# Patient Record
Sex: Male | Born: 1955 | Race: White | Hispanic: No | Marital: Married | Smoking: Never smoker
Health system: Southern US, Community
[De-identification: ages and names within clinical notes are randomized; demographics above are authoritative.]

## PROBLEM LIST (undated history)

## (undated) DIAGNOSIS — T4145XA Adverse effect of unspecified anesthetic, initial encounter: Secondary | ICD-10-CM

## (undated) DIAGNOSIS — K219 Gastro-esophageal reflux disease without esophagitis: Secondary | ICD-10-CM

## (undated) DIAGNOSIS — G8929 Other chronic pain: Secondary | ICD-10-CM

## (undated) DIAGNOSIS — T8859XA Other complications of anesthesia, initial encounter: Secondary | ICD-10-CM

## (undated) DIAGNOSIS — I10 Essential (primary) hypertension: Secondary | ICD-10-CM

## (undated) HISTORY — DX: Other chronic pain: G89.29

## (undated) HISTORY — PX: BACK SURGERY: SHX140

## (undated) HISTORY — PX: ANKLE SURGERY: SHX546

## (undated) HISTORY — PX: WISDOM TOOTH EXTRACTION: SHX21

## (undated) HISTORY — PX: TONSILLECTOMY: SUR1361

---

## 2016-06-05 ENCOUNTER — Other Ambulatory Visit: Payer: Self-pay | Admitting: Orthopedic Surgery

## 2016-06-26 ENCOUNTER — Encounter (HOSPITAL_COMMUNITY)
Admission: RE | Admit: 2016-06-26 | Discharge: 2016-06-26 | Disposition: A | Discharge status: Home / Self Care | Payer: Worker's Compensation | Source: Ambulatory Visit | Attending: Orthopedic Surgery | Admitting: Orthopedic Surgery

## 2016-06-26 ENCOUNTER — Other Ambulatory Visit (HOSPITAL_COMMUNITY): Payer: Self-pay | Admitting: *Deleted

## 2016-06-26 ENCOUNTER — Encounter (HOSPITAL_COMMUNITY): Payer: Self-pay

## 2016-06-26 DIAGNOSIS — Z01810 Encounter for preprocedural cardiovascular examination: Secondary | ICD-10-CM | POA: Diagnosis present

## 2016-06-26 DIAGNOSIS — M79604 Pain in right leg: Secondary | ICD-10-CM | POA: Insufficient documentation

## 2016-06-26 DIAGNOSIS — I700 Atherosclerosis of aorta: Secondary | ICD-10-CM | POA: Insufficient documentation

## 2016-06-26 DIAGNOSIS — Z01812 Encounter for preprocedural laboratory examination: Secondary | ICD-10-CM | POA: Insufficient documentation

## 2016-06-26 DIAGNOSIS — Z01818 Encounter for other preprocedural examination: Secondary | ICD-10-CM | POA: Diagnosis not present

## 2016-06-26 DIAGNOSIS — K219 Gastro-esophageal reflux disease without esophagitis: Secondary | ICD-10-CM | POA: Insufficient documentation

## 2016-06-26 DIAGNOSIS — M79605 Pain in left leg: Secondary | ICD-10-CM | POA: Insufficient documentation

## 2016-06-26 DIAGNOSIS — K210 Gastro-esophageal reflux disease with esophagitis: Secondary | ICD-10-CM | POA: Insufficient documentation

## 2016-06-26 DIAGNOSIS — I10 Essential (primary) hypertension: Secondary | ICD-10-CM | POA: Insufficient documentation

## 2016-06-26 HISTORY — DX: Gastro-esophageal reflux disease without esophagitis: K21.9

## 2016-06-26 HISTORY — DX: Essential (primary) hypertension: I10

## 2016-06-26 HISTORY — DX: Adverse effect of unspecified anesthetic, initial encounter: T41.45XA

## 2016-06-26 HISTORY — DX: Other complications of anesthesia, initial encounter: T88.59XA

## 2016-06-26 LAB — CBC WITH DIFFERENTIAL/PLATELET
Basophils Absolute: 0 10*3/uL (ref 0.0–0.1)
Basophils Relative: 1 %
EOS ABS: 0.1 10*3/uL (ref 0.0–0.7)
Eosinophils Relative: 1 %
HCT: 44.4 % (ref 39.0–52.0)
Hemoglobin: 14.7 g/dL (ref 13.0–17.0)
LYMPHS ABS: 1.8 10*3/uL (ref 0.7–4.0)
Lymphocytes Relative: 21 %
MCH: 29.7 pg (ref 26.0–34.0)
MCHC: 33.1 g/dL (ref 30.0–36.0)
MCV: 89.7 fL (ref 78.0–100.0)
Monocytes Absolute: 0.8 10*3/uL (ref 0.1–1.0)
Monocytes Relative: 10 %
Neutro Abs: 5.9 10*3/uL (ref 1.7–7.7)
Neutrophils Relative %: 67 %
Platelets: 210 10*3/uL (ref 150–400)
RBC: 4.95 MIL/uL (ref 4.22–5.81)
RDW: 13.4 % (ref 11.5–15.5)
WBC: 8.6 10*3/uL (ref 4.0–10.5)

## 2016-06-26 LAB — COMPREHENSIVE METABOLIC PANEL
ALBUMIN: 4.3 g/dL (ref 3.5–5.0)
ALK PHOS: 58 U/L (ref 38–126)
ALT: 35 U/L (ref 17–63)
AST: 28 U/L (ref 15–41)
Anion gap: 10 (ref 5–15)
BUN: 12 mg/dL (ref 6–20)
CALCIUM: 9.9 mg/dL (ref 8.9–10.3)
CO2: 26 mmol/L (ref 22–32)
CREATININE: 1.37 mg/dL — AB (ref 0.61–1.24)
Chloride: 104 mmol/L (ref 101–111)
GFR calc non Af Amer: 54 mL/min — ABNORMAL LOW (ref >60)
GLUCOSE: 93 mg/dL (ref 65–99)
Potassium: 4 mmol/L (ref 3.5–5.1)
SODIUM: 140 mmol/L (ref 135–145)
Total Bilirubin: 0.4 mg/dL (ref 0.3–1.2)
Total Protein: 7.4 g/dL (ref 6.5–8.1)

## 2016-06-26 LAB — URINALYSIS, ROUTINE W REFLEX MICROSCOPIC
BILIRUBIN URINE: NEGATIVE
Bacteria, UA: NONE SEEN
Glucose, UA: NEGATIVE mg/dL
Hgb urine dipstick: NEGATIVE
Ketones, ur: NEGATIVE mg/dL
Nitrite: NEGATIVE
Protein, ur: NEGATIVE mg/dL
SQUAMOUS EPITHELIAL / LPF: NONE SEEN
Specific Gravity, Urine: 1.018 (ref 1.005–1.030)
pH: 6 (ref 5.0–8.0)

## 2016-06-26 LAB — PROTIME-INR
INR: 0.96
Prothrombin Time: 12.8 seconds (ref 11.4–15.2)

## 2016-06-26 LAB — TYPE AND SCREEN
ABO/RH(D): A POS
Antibody Screen: NEGATIVE

## 2016-06-26 LAB — ABO/RH: ABO/RH(D): A POS

## 2016-06-26 LAB — SURGICAL PCR SCREEN
MRSA, PCR: NEGATIVE
Staphylococcus aureus: NEGATIVE

## 2016-06-26 LAB — APTT: aPTT: 29 seconds (ref 24–36)

## 2016-06-26 NOTE — Pre-Procedure Instructions (Addendum)
George GhaziRobert A Aguirre  06/26/2016    Your procedure is scheduled on Thursday, July 03, 2016 at 12:10 PM.   [facility] "A" Admitting Office at 9:10 AM.   Call this number if you have problems the morning of surgery: [phone]   Questions prior to day of surgery, please call [phone] between 8 & 4 PM.   Remember:  Do not eat food or drink liquids after midnight Wednesday, 07/02/16. Medicines to take the day of surgery: NONE  Stop Multivitamins, NSAIDS (Ibuprofen, Aleve, etc), Aspirin, Herbal medications and Cod Liver Oil 5 days prior to surgery.    Do not wear jewelry.  Do not wear lotions, powders, cologne or deodorant.  Men may shave face and neck.  Do not bring valuables to the hospital.  [facility] is not responsible for any belongings or valuables.  Contacts, dentures or bridgework may not be worn into surgery.  Leave your suitcase in the car.  After surgery it may be brought to your room.  For patients admitted to the hospital, discharge time will be determined by your treatment team.  Special instructions:  [facility] - Preparing for Surgery  Before surgery, you can play an important role.  Because skin is not sterile, your skin needs to be as free of germs as possible.  You can reduce the number of germs on you skin by washing with CHG (chlorahexidine gluconate) soap before surgery.  CHG is an antiseptic cleaner which kills germs and bonds with the skin to continue killing germs even after washing.  Please DO NOT use if you have an allergy to CHG or antibacterial soaps.  If your skin becomes reddened/irritated stop using the CHG and inform your nurse when you arrive at Short Stay.  Do not shave (including legs and underarms) for at least 48 hours prior to the first CHG shower.  You may shave your face.  Please follow these instructions carefully:   1.  Shower with CHG Soap the night before surgery and the                    morning of  Surgery.  2.  If you choose to wash your hair, wash your hair first as usual with your       normal shampoo.  3.  After you shampoo, rinse your hair and body thoroughly to remove the shampoo.  4.  Use CHG as you would any other liquid soap.  You can apply chg directly       to the skin and wash gently with scrungie or a clean washcloth.  5.  Apply the CHG Soap to your body ONLY FROM THE NECK DOWN.        Do not use on open wounds or open sores.  Avoid contact with your eyes, ears, mouth and genitals (private parts).  Wash genitals (private parts) with your normal soap.  6.  Wash thoroughly, paying special attention to the area where your surgery        will be performed.  7.  Thoroughly rinse your body with warm water from the neck down.  8.  DO NOT shower/wash with your normal soap after using and rinsing off       the CHG Soap.  9.  Pat yourself dry with a clean towel.            10.  Wear clean pajamas.  11.  Place clean sheets on your bed the night of your first shower and do not        sleep with pets.  Day of Surgery  Do not apply any lotions/deodorants the morning of surgery.  Please wear clean clothes to the hospital.   Please read over the fact sheets that you were given.

## 2016-06-26 NOTE — Progress Notes (Signed)
PCP: Dr. [facility]ine [facility]ters at [facility] No cardiologist, pt reports [facility], and [facility], stress test and ECHO, [facility].   Pt reports possible reaction to Ketamines during [facility], hard to wake up and had to be re-intubated after surgery. No problems second surgery and per pt ketamines were not used during second surgery.   CXR: 06/26/16 EKG: 06/26/16   Chart forwarded to anesthesia d/t hx anesthesia reaction

## 2016-06-26 NOTE — Progress Notes (Addendum)
Anesthesia PAT Evaluation: Patient is a 61 year old male scheduled for right sided L4-5 lateral interbody fusion, L4-5 posterior lumbar fusion on 07/03/16 by Dr. Lynann Bologna.  History includes non-smoker, HTN, GERD, back surgery X 2, ankle surgery.   Anesthesia history: He reported that he had back surgery (at left L4-5) at [facility] [phone]) around 09/02/13. He was extubated, but once in recovery felt weak, couldn't really move, and was unable to get ice chips in his mouth. The last thing he remembers is the nurse putting a face mask on him and then waking up in the ICU. He says the anesthesiologist told his wife that they thought he reacted to ketamine. His second surgery (repeat left L4-5, and right L4-5) went smoothly, but they did not give him the believed offending agent (reported as ketamine). He is adopted and does not know if there is any family history of anesthesia reactions. He does not report a history of difficult airway/intubation, but on exam he does have prominent teeth with only 2-3 FB between his teeth. Neck is a little short.   PCP is Dr. Jonathon Jordan with [facility].  He is not regularly followed by a cardiologist, but his PCP at the time referred him to Dr. Hurshel Keys at [facility] [phone]). He reports that he had a negative exercise stress test and echo. Patient states it was routine testing--not for any specific symptoms--and did not require on-going cardiology follow-up. (Staff at Dr. Shelly Flatten office reported records would need to be requested from [facility])  Meds include amlodipine-benazepril, ASA 81 mg, cod liver oil, Norco, magnesium.   BP 132/86   Pulse 100   Temp 36.8 C   Resp 20   Ht 5' 11"  (1.803 m)   Wt 239 lb 12.8 oz (108.8 kg)   SpO2 99%   BMI 33.45 kg/m  Heart RRR, no murmur noted. Lungs clear. No ankle edema. No carotid bruits noted.  EKG 06/26/16: NSR at 79 bpm. Probable insignificant Q waves in  inferolateral leads. Non-specific inferior T wave abnormality. (Still waiting cardiology to confirm.)  Stress test 2014 [facility]): Requested.  Echo 2014 [facility]):   CXR 06/26/16: IMPRESSION: There is no acute cardiopulmonary abnormality. Thoracic aortic atherosclerosis.  Preoperative labs noted.  Cardiac testing and anesthesia records requested from [facility]. Additional input following review of records.  George Hugh [facility] Short Stay Center/Anesthesiology Phone [phone] 06/26/2016 11:16 AM  Addendum: The following records were received from [facility]. Anesthesia records summarized below, but copies can be found on patient's physical chart.  08/17/15: Lumbar laminotomy, partial facetectomy and foraminootmy with diskectomy and decompression L4-5 left side, redo. Right L4-5 laminotomy, partial facetectomy, and laminotomy with diskectomy.   Meds documented on anesthesia record include ISO, ROC, fentanyl, propofol, Zofran, Decadron, Neo-synephrine. Received reversal agent Bridion. MAC blade used to place 7.5 ETT X 1 attempt. Easy mask ventilation. He was discharged home on 08/18/15 (POD #1).  09/02/13: Left L4-5 laminotomy, discectomy, partial facetectomy and foraminotomy.  Meds documented on anesthesia record include ISO, ROC, fentanyl, propofol, Zofran, ketamine, Neo-Synephrine. Received reversal agents neostigmine and glycopyrrolate. MAC blade used to place 7.5 ETT X 2 attempts. Easy mask ventilation. The discharge summary was not telling of events described by patient (regarding response to ketamine). He was discharged home on 09/03/13 (POD #1).   Nuclear stress test 05/04/12: Impressions: 1. Myocardial perfusion imaging is normal. There is no ischemia or previous infarction in the current  study. Calculated EF 3%. There was no previous nuclear study for comparison. 2. The exercise ECG was negative. 3. Stress test was  clinically negative for chest pain. The patient demonstrated good exercise tolerance.  Echo 05/04/12: Summary: 1. Sinus rhythm. 2. This was a technically good study. 3. The left ventricular size is normal. 4. No regional wall motion abnormalities were noted. 5. Diastolic filling pattern indicates mild dysfunction (grade 1) with normal LA size. 6. The LA volume index is 17.48 mL/m consistent with normal left atrial volume. 7. Aortic valve is trileaflet and appears structurally normal. No aortic stenosis or regurgitation. 8. Mitral valve anatomy and motion appears normal. 9. There is trace to mild mitral regurgitation. 10. The tricuspid valve appears structurally normal. 11. Mild tricuspid regurgitation present. 12. The right ventricular systolic pressure, as measured by Doppler, is 32.32 mmHg. 13. There is no pericardial effusion.  EKG 06/26/16 was confirmed by cardiologist Dr. Doylene Canard as: NSR, nonspecific intraventricular conduction delay. Overall I think tracing is similar to 07/13/2015 tracing from [facility].  If no acute changes then I anticipate that he can proceed as planned. Definitive anesthesia plan will be discussed by his anesthesiologist on the day of surgery.  George Hugh [facility] Short Stay Center/Anesthesiology Phone [phone] 06/27/2016 10:39 AM

## 2016-07-02 ENCOUNTER — Encounter (HOSPITAL_COMMUNITY): Payer: Self-pay | Admitting: Anesthesiology

## 2016-07-02 NOTE — Anesthesia Preprocedure Evaluation (Addendum)
Anesthesia Evaluation  Patient identified by MRN, date of birth, ID band Patient awake    Reviewed: Allergy & Precautions, NPO status , Patient's Chart, lab work & pertinent test results  Airway Mallampati: III  TM Distance: >3 FB Neck ROM: Full  Mouth opening: Limited Mouth Opening  Dental  (+) Teeth Intact, Dental Advisory Given   Pulmonary neg pulmonary ROS,    breath sounds clear to auscultation       Cardiovascular hypertension, Pt. on medications negative cardio ROS   Rhythm:Regular Rate:Normal     Neuro/Psych negative neurological ROS  negative psych ROS   GI/Hepatic negative GI ROS, Neg liver ROS, GERD  Medicated,  Endo/Other  negative endocrine ROS  Renal/GU negative Renal ROS  negative genitourinary   Musculoskeletal negative musculoskeletal ROS (+)   Abdominal   Peds  Hematology negative hematology ROS (+)   Anesthesia Other Findings Day of surgery medications reviewed with the patient.  Reproductive/Obstetrics negative OB ROS                           Lab Results  Component Value Date   WBC 8.6 06/26/2016   HGB 14.7 06/26/2016   HCT 44.4 06/26/2016   MCV 89.7 06/26/2016   PLT 210 06/26/2016   Lab Results  Component Value Date   CREATININE 1.37 (H) 06/26/2016   BUN 12 06/26/2016   NA 140 06/26/2016   K 4.0 06/26/2016   CL 104 06/26/2016   CO2 26 06/26/2016   Lab Results  Component Value Date   INR 0.96 06/26/2016   EKG: NSR  Anesthesia Physical Anesthesia Plan  ASA: II  Anesthesia Plan: General   Post-op Pain Management:    Induction: Intravenous  Airway Management Planned: Oral ETT and Video Laryngoscope Planned  Additional Equipment:   Intra-op Plan:   Post-operative Plan: Extubation in OR  Informed Consent: I have reviewed the patients History and Physical, chart, labs and discussed the procedure including the risks, benefits and alternatives  for the proposed anesthesia with the patient or authorized representative who has indicated his/her understanding and acceptance.   Dental advisory given  Plan Discussed with: CRNA  Anesthesia Plan Comments: (Very limited mouth, recommend glidescope intubation for all future intubations. )      Anesthesia Quick Evaluation

## 2016-07-03 ENCOUNTER — Encounter (HOSPITAL_COMMUNITY): Payer: Self-pay | Admitting: Urology

## 2016-07-03 ENCOUNTER — Inpatient Hospital Stay (HOSPITAL_COMMUNITY): Payer: Worker's Compensation

## 2016-07-03 ENCOUNTER — Inpatient Hospital Stay (HOSPITAL_COMMUNITY)
Admission: RE | Admit: 2016-07-03 | Discharge: 2016-07-04 | DRG: 455 | Disposition: A | Discharge status: Home / Self Care | Payer: Worker's Compensation | Source: Ambulatory Visit | Attending: Orthopedic Surgery | Admitting: Orthopedic Surgery

## 2016-07-03 ENCOUNTER — Encounter (HOSPITAL_COMMUNITY)
Admission: RE | Disposition: A | Discharge status: Home / Self Care | Payer: Self-pay | Source: Ambulatory Visit | Attending: Orthopedic Surgery

## 2016-07-03 ENCOUNTER — Inpatient Hospital Stay (HOSPITAL_COMMUNITY): Payer: Worker's Compensation | Admitting: Anesthesiology

## 2016-07-03 ENCOUNTER — Inpatient Hospital Stay (HOSPITAL_COMMUNITY): Payer: Worker's Compensation | Admitting: Vascular Surgery

## 2016-07-03 DIAGNOSIS — Z7982 Long term (current) use of aspirin: Secondary | ICD-10-CM

## 2016-07-03 DIAGNOSIS — M5116 Intervertebral disc disorders with radiculopathy, lumbar region: Principal | ICD-10-CM | POA: Diagnosis present

## 2016-07-03 DIAGNOSIS — K219 Gastro-esophageal reflux disease without esophagitis: Secondary | ICD-10-CM | POA: Diagnosis present

## 2016-07-03 DIAGNOSIS — I10 Essential (primary) hypertension: Secondary | ICD-10-CM | POA: Diagnosis present

## 2016-07-03 DIAGNOSIS — Z880 Allergy status to penicillin: Secondary | ICD-10-CM

## 2016-07-03 DIAGNOSIS — M48061 Spinal stenosis, lumbar region without neurogenic claudication: Secondary | ICD-10-CM | POA: Diagnosis present

## 2016-07-03 DIAGNOSIS — Z79899 Other long term (current) drug therapy: Secondary | ICD-10-CM

## 2016-07-03 DIAGNOSIS — Z419 Encounter for procedure for purposes other than remedying health state, unspecified: Secondary | ICD-10-CM

## 2016-07-03 DIAGNOSIS — M545 Low back pain: Secondary | ICD-10-CM | POA: Diagnosis present

## 2016-07-03 DIAGNOSIS — M5410 Radiculopathy, site unspecified: Secondary | ICD-10-CM | POA: Diagnosis present

## 2016-07-03 HISTORY — PX: ANTERIOR LAT LUMBAR FUSION: SHX1168

## 2016-07-03 SURGERY — ANTERIOR LATERAL LUMBAR FUSION 1 LEVEL
Anesthesia: General | Laterality: Right

## 2016-07-03 MED ORDER — FLEET ENEMA 7-19 GM/118ML RE ENEM: 1.0000 | ENEMA | Freq: Once | RECTAL | Status: DC | PRN

## 2016-07-03 MED ORDER — PHENYLEPHRINE HCL 10 MG/ML IJ SOLN
INTRAVENOUS | Status: DC | PRN
  Administered 2016-07-03: 20 ug/min via INTRAVENOUS

## 2016-07-03 MED ORDER — ACETAMINOPHEN 650 MG RE SUPP: 650.0000 mg | RECTAL | Status: DC | PRN

## 2016-07-03 MED ORDER — CEFAZOLIN SODIUM-DEXTROSE 2-4 GM/100ML-% IV SOLN
2.0000 g | INTRAVENOUS | Status: DC
  Filled 2016-07-03: qty 100

## 2016-07-03 MED ORDER — ALUM & MAG HYDROXIDE-SIMETH 200-200-20 MG/5ML PO SUSP: 30.0000 mL | Freq: Four times a day (QID) | ORAL | Status: DC | PRN

## 2016-07-03 MED ORDER — BUPIVACAINE HCL (PF) 0.25 % IJ SOLN
INTRAMUSCULAR | Status: AC
  Filled 2016-07-03: qty 30

## 2016-07-03 MED ORDER — VANCOMYCIN HCL IN DEXTROSE 1-5 GM/200ML-% IV SOLN
INTRAVENOUS | Status: AC
Start: 2016-07-03 — End: 2016-07-03
  Filled 2016-07-03: qty 200

## 2016-07-03 MED ORDER — DEXAMETHASONE SODIUM PHOSPHATE 10 MG/ML IJ SOLN
INTRAMUSCULAR | Status: AC
  Filled 2016-07-03: qty 1

## 2016-07-03 MED ORDER — ACETAMINOPHEN 325 MG PO TABS
650.0000 mg | ORAL_TABLET | ORAL | Status: DC | PRN
Start: 2016-07-03 — End: 2016-07-04

## 2016-07-03 MED ORDER — SUCCINYLCHOLINE CHLORIDE 200 MG/10ML IV SOSY
PREFILLED_SYRINGE | INTRAVENOUS | Status: AC
  Filled 2016-07-03: qty 10

## 2016-07-03 MED ORDER — ZOLPIDEM TARTRATE 5 MG PO TABS
5.0000 mg | ORAL_TABLET | Freq: Every evening | ORAL | Status: DC | PRN
Start: 2016-07-03 — End: 2016-07-04

## 2016-07-03 MED ORDER — PROPOFOL 10 MG/ML IV BOLUS
INTRAVENOUS | Status: DC | PRN
  Administered 2016-07-03: 40 mg via INTRAVENOUS
  Administered 2016-07-03: 160 mg via INTRAVENOUS
  Administered 2016-07-03: 40 mg via INTRAVENOUS

## 2016-07-03 MED ORDER — LIDOCAINE 2% (20 MG/ML) 5 ML SYRINGE
INTRAMUSCULAR | Status: AC
  Filled 2016-07-03: qty 5

## 2016-07-03 MED ORDER — HEMOSTATIC AGENTS (NO CHARGE) OPTIME
TOPICAL | Status: DC | PRN
  Administered 2016-07-03: 1 via TOPICAL

## 2016-07-03 MED ORDER — LIDOCAINE HCL (CARDIAC) 20 MG/ML IV SOLN
INTRAVENOUS | Status: DC | PRN
  Administered 2016-07-03: 60 mg via INTRAVENOUS

## 2016-07-03 MED ORDER — BUPIVACAINE HCL (PF) 0.25 % IJ SOLN
INTRAMUSCULAR | Status: AC
  Filled 2016-07-03: qty 60

## 2016-07-03 MED ORDER — PHENOL 1.4 % MT LIQD: 1.0000 | OROMUCOSAL | Status: DC | PRN

## 2016-07-03 MED ORDER — AMLODIPINE BESY-BENAZEPRIL HCL 5-20 MG PO CAPS: 1.0000 | ORAL_CAPSULE | Freq: Every day | ORAL | Status: DC

## 2016-07-03 MED ORDER — FENTANYL CITRATE (PF) 100 MCG/2ML IJ SOLN
INTRAMUSCULAR | Status: AC
  Filled 2016-07-03: qty 2

## 2016-07-03 MED ORDER — LACTATED RINGERS IV SOLN
INTRAVENOUS | Status: DC | PRN
  Administered 2016-07-03 (×2): via INTRAVENOUS

## 2016-07-03 MED ORDER — ONDANSETRON HCL 4 MG PO TABS: 4.0000 mg | ORAL_TABLET | Freq: Four times a day (QID) | ORAL | Status: DC | PRN

## 2016-07-03 MED ORDER — PROPOFOL 500 MG/50ML IV EMUL
INTRAVENOUS | Status: DC | PRN
  Administered 2016-07-03: 90 ug/kg/min via INTRAVENOUS
  Administered 2016-07-03: 50 ug/kg/min via INTRAVENOUS
  Administered 2016-07-03: 11:00:00 via INTRAVENOUS

## 2016-07-03 MED ORDER — VITAMIN D 1000 UNITS PO TABS
2000.0000 [IU] | ORAL_TABLET | Freq: Every day | ORAL | Status: DC
  Administered 2016-07-04: 2000 [IU] via ORAL
  Filled 2016-07-03: qty 2

## 2016-07-03 MED ORDER — EPINEPHRINE PF 1 MG/ML IJ SOLN
INTRAMUSCULAR | Status: DC | PRN
  Administered 2016-07-03 (×2): .15 mL

## 2016-07-03 MED ORDER — LACTATED RINGERS IV SOLN: INTRAVENOUS | Status: DC

## 2016-07-03 MED ORDER — THROMBIN 20000 UNITS EX KIT
PACK | CUTANEOUS | Status: DC | PRN
  Administered 2016-07-03: 20000 [IU] via TOPICAL

## 2016-07-03 MED ORDER — MORPHINE SULFATE (PF) 2 MG/ML IV SOLN
1.0000 mg | INTRAVENOUS | Status: DC | PRN
Start: 2016-07-03 — End: 2016-07-04

## 2016-07-03 MED ORDER — MAGNESIUM OXIDE 400 (241.3 MG) MG PO TABS
400.0000 mg | ORAL_TABLET | Freq: Every day | ORAL | Status: DC
  Administered 2016-07-04: 400 mg via ORAL
  Filled 2016-07-03: qty 1

## 2016-07-03 MED ORDER — MEPERIDINE HCL 25 MG/ML IJ SOLN: 6.2500 mg | INTRAMUSCULAR | Status: DC | PRN

## 2016-07-03 MED ORDER — DIAZEPAM 5 MG PO TABS
ORAL_TABLET | ORAL | Status: AC
  Administered 2016-07-03: 5 mg via ORAL
  Filled 2016-07-03: qty 1

## 2016-07-03 MED ORDER — PHENYLEPHRINE HCL 10 MG/ML IJ SOLN
INTRAMUSCULAR | Status: AC
  Filled 2016-07-03: qty 1

## 2016-07-03 MED ORDER — AMLODIPINE BESYLATE 5 MG PO TABS
5.0000 mg | ORAL_TABLET | Freq: Every day | ORAL | Status: DC
  Administered 2016-07-04: 5 mg via ORAL
  Filled 2016-07-03: qty 1

## 2016-07-03 MED ORDER — POVIDONE-IODINE 7.5 % EX SOLN: Freq: Once | CUTANEOUS | Status: DC

## 2016-07-03 MED ORDER — METHYLENE BLUE 0.5 % INJ SOLN: INTRAVENOUS | Status: DC | PRN

## 2016-07-03 MED ORDER — VANCOMYCIN HCL 10 G IV SOLR
1500.0000 mg | Freq: Once | INTRAVENOUS | Status: AC
  Administered 2016-07-03: 1500 mg via INTRAVENOUS
  Filled 2016-07-03: qty 1500

## 2016-07-03 MED ORDER — MENTHOL 3 MG MT LOZG: 1.0000 | LOZENGE | OROMUCOSAL | Status: DC | PRN

## 2016-07-03 MED ORDER — BISACODYL 5 MG PO TBEC: 5.0000 mg | DELAYED_RELEASE_TABLET | Freq: Every day | ORAL | Status: DC | PRN

## 2016-07-03 MED ORDER — SUCCINYLCHOLINE CHLORIDE 20 MG/ML IJ SOLN
INTRAMUSCULAR | Status: DC | PRN
  Administered 2016-07-03: 140 mg via INTRAVENOUS

## 2016-07-03 MED ORDER — 0.9 % SODIUM CHLORIDE (POUR BTL) OPTIME
TOPICAL | Status: DC | PRN
  Administered 2016-07-03 (×2): 1000 mL

## 2016-07-03 MED ORDER — PANTOPRAZOLE SODIUM 40 MG IV SOLR
40.0000 mg | Freq: Every day | INTRAVENOUS | Status: DC
  Administered 2016-07-03: 40 mg via INTRAVENOUS
  Filled 2016-07-03: qty 40

## 2016-07-03 MED ORDER — POTASSIUM CHLORIDE IN NACL 20-0.9 MEQ/L-% IV SOLN
INTRAVENOUS | Status: DC
  Administered 2016-07-03: 10 mL/h via INTRAVENOUS
  Filled 2016-07-03: qty 1000

## 2016-07-03 MED ORDER — OXYCODONE HCL 5 MG PO TABS
5.0000 mg | ORAL_TABLET | ORAL | Status: DC | PRN
  Administered 2016-07-03: 5 mg via ORAL
  Administered 2016-07-03: 10 mg via ORAL
  Administered 2016-07-03 – 2016-07-04 (×4): 5 mg via ORAL
  Filled 2016-07-03 (×7): qty 1

## 2016-07-03 MED ORDER — ACETAMINOPHEN 10 MG/ML IV SOLN
1000.0000 mg | INTRAVENOUS | Status: AC
  Administered 2016-07-03: 1000 mg via INTRAVENOUS
  Filled 2016-07-03: qty 100

## 2016-07-03 MED ORDER — SODIUM CHLORIDE 0.9% FLUSH: 3.0000 mL | Freq: Two times a day (BID) | INTRAVENOUS | Status: DC

## 2016-07-03 MED ORDER — SODIUM CHLORIDE 0.9 % IV SOLN: 250.0000 mL | INTRAVENOUS | Status: DC

## 2016-07-03 MED ORDER — THROMBIN 20000 UNITS EX SOLR
CUTANEOUS | Status: AC
  Filled 2016-07-03: qty 20000

## 2016-07-03 MED ORDER — PROMETHAZINE HCL 25 MG/ML IJ SOLN: 6.2500 mg | INTRAMUSCULAR | Status: DC | PRN

## 2016-07-03 MED ORDER — OXYCODONE HCL 5 MG PO TABS
ORAL_TABLET | ORAL | Status: AC
  Administered 2016-07-03: 10 mg via ORAL
  Filled 2016-07-03: qty 2

## 2016-07-03 MED ORDER — VANCOMYCIN HCL IN DEXTROSE 1-5 GM/200ML-% IV SOLN
INTRAVENOUS | Status: AC
  Filled 2016-07-03: qty 200

## 2016-07-03 MED ORDER — BENAZEPRIL HCL 20 MG PO TABS
20.0000 mg | ORAL_TABLET | Freq: Every day | ORAL | Status: DC
  Administered 2016-07-04: 20 mg via ORAL
  Filled 2016-07-03: qty 1

## 2016-07-03 MED ORDER — ONDANSETRON HCL 4 MG/2ML IJ SOLN
INTRAMUSCULAR | Status: AC
  Filled 2016-07-03: qty 2

## 2016-07-03 MED ORDER — DOCUSATE SODIUM 100 MG PO CAPS
100.0000 mg | ORAL_CAPSULE | Freq: Two times a day (BID) | ORAL | Status: DC
  Administered 2016-07-03 – 2016-07-04 (×2): 100 mg via ORAL
  Filled 2016-07-03 (×2): qty 1

## 2016-07-03 MED ORDER — ONDANSETRON HCL 4 MG/2ML IJ SOLN
INTRAMUSCULAR | Status: DC | PRN
  Administered 2016-07-03: 4 mg via INTRAVENOUS

## 2016-07-03 MED ORDER — HYDROMORPHONE HCL 1 MG/ML IJ SOLN
INTRAMUSCULAR | Status: AC
  Administered 2016-07-03: 0.5 mg via INTRAVENOUS
  Filled 2016-07-03: qty 1

## 2016-07-03 MED ORDER — MIDAZOLAM HCL 2 MG/2ML IJ SOLN
INTRAMUSCULAR | Status: AC
  Filled 2016-07-03: qty 2

## 2016-07-03 MED ORDER — ONDANSETRON HCL 4 MG/2ML IJ SOLN: 4.0000 mg | Freq: Four times a day (QID) | INTRAMUSCULAR | Status: DC | PRN

## 2016-07-03 MED ORDER — PHENYLEPHRINE HCL 10 MG/ML IJ SOLN
INTRAMUSCULAR | Status: DC | PRN
  Administered 2016-07-03 (×2): 40 ug via INTRAVENOUS
  Administered 2016-07-03: 80 ug via INTRAVENOUS
  Administered 2016-07-03: 40 ug via INTRAVENOUS
  Administered 2016-07-03: 80 ug via INTRAVENOUS
  Administered 2016-07-03: 40 ug via INTRAVENOUS
  Administered 2016-07-03: 80 ug via INTRAVENOUS

## 2016-07-03 MED ORDER — FENTANYL CITRATE (PF) 100 MCG/2ML IJ SOLN
INTRAMUSCULAR | Status: DC | PRN
  Administered 2016-07-03 (×3): 50 ug via INTRAVENOUS
  Administered 2016-07-03: 100 ug via INTRAVENOUS

## 2016-07-03 MED ORDER — FENTANYL CITRATE (PF) 100 MCG/2ML IJ SOLN
INTRAMUSCULAR | Status: AC
  Filled 2016-07-03: qty 4

## 2016-07-03 MED ORDER — MIDAZOLAM HCL 5 MG/5ML IJ SOLN
INTRAMUSCULAR | Status: DC | PRN
  Administered 2016-07-03 (×2): 1 mg via INTRAVENOUS

## 2016-07-03 MED ORDER — BUPIVACAINE LIPOSOME 1.3 % IJ SUSP
20.0000 mL | INTRAMUSCULAR | Status: AC
  Administered 2016-07-03: 20 mL
  Filled 2016-07-03: qty 20

## 2016-07-03 MED ORDER — SODIUM CHLORIDE 0.9% FLUSH: 3.0000 mL | INTRAVENOUS | Status: DC | PRN

## 2016-07-03 MED ORDER — PROPOFOL 10 MG/ML IV BOLUS
INTRAVENOUS | Status: AC
  Filled 2016-07-03: qty 20

## 2016-07-03 MED ORDER — HYDROMORPHONE HCL 1 MG/ML IJ SOLN
0.2500 mg | INTRAMUSCULAR | Status: DC | PRN
  Administered 2016-07-03: 0.5 mg via INTRAVENOUS

## 2016-07-03 MED ORDER — METHYLENE BLUE 0.5 % INJ SOLN
INTRAVENOUS | Status: AC
  Filled 2016-07-03: qty 10

## 2016-07-03 MED ORDER — BUPIVACAINE HCL 0.25 % IJ SOLN
INTRAMUSCULAR | Status: DC | PRN
  Administered 2016-07-03: 20 mL
  Administered 2016-07-03: 4 mL
  Administered 2016-07-03: 5 mL

## 2016-07-03 MED ORDER — VANCOMYCIN HCL 1000 MG IV SOLR
1000.0000 mg | Freq: Two times a day (BID) | INTRAVENOUS | Status: DC
  Administered 2016-07-03: 1000 mg via INTRAVENOUS

## 2016-07-03 MED ORDER — EPINEPHRINE PF 1 MG/ML IJ SOLN
INTRAMUSCULAR | Status: AC
  Filled 2016-07-03: qty 2

## 2016-07-03 MED ORDER — ADULT MULTIVITAMIN W/MINERALS CH
1.0000 | ORAL_TABLET | Freq: Every day | ORAL | Status: DC
  Administered 2016-07-04: 1 via ORAL
  Filled 2016-07-03: qty 1

## 2016-07-03 MED ORDER — RISAQUAD PO CAPS
1.0000 | ORAL_CAPSULE | Freq: Every day | ORAL | Status: DC
  Administered 2016-07-04: 1 via ORAL
  Filled 2016-07-03: qty 1

## 2016-07-03 MED ORDER — DEXAMETHASONE SODIUM PHOSPHATE 10 MG/ML IJ SOLN
INTRAMUSCULAR | Status: DC | PRN
  Administered 2016-07-03: 10 mg via INTRAVENOUS

## 2016-07-03 MED ORDER — DIAZEPAM 5 MG PO TABS
5.0000 mg | ORAL_TABLET | Freq: Four times a day (QID) | ORAL | Status: DC | PRN
  Administered 2016-07-03 – 2016-07-04 (×2): 5 mg via ORAL
  Filled 2016-07-03: qty 1

## 2016-07-03 MED ORDER — SENNOSIDES-DOCUSATE SODIUM 8.6-50 MG PO TABS: 1.0000 | ORAL_TABLET | Freq: Every evening | ORAL | Status: DC | PRN

## 2016-07-03 MED ORDER — PHENYLEPHRINE 40 MCG/ML (10ML) SYRINGE FOR IV PUSH (FOR BLOOD PRESSURE SUPPORT)
PREFILLED_SYRINGE | INTRAVENOUS | Status: AC
  Filled 2016-07-03: qty 10

## 2016-07-03 SURGICAL SUPPLY — 102 items
BENZOIN TINCTURE PRP APPL 2/3 (GAUZE/BANDAGES/DRESSINGS) ×6 IMPLANT
BLADE CLIPPER SURG (BLADE) ×3 IMPLANT
BLADE SURG 10 STRL SS (BLADE) ×6 IMPLANT
BOLT PLATE XLIF 5.5X55 LRG (Bolt) ×6 IMPLANT
BONE VIVIGEN FORMABLE 10CC (Bone Implant) ×3 IMPLANT
BUR SABER RD CUTTING 3.0 (BURR) IMPLANT
CLSR STERI-STRIP ANTIMIC 1/2X4 (GAUZE/BANDAGES/DRESSINGS) ×9 IMPLANT
CONT SPEC STER OR (MISCELLANEOUS) IMPLANT
CORDS BIPOLAR (ELECTRODE) ×3 IMPLANT
COROENT XL WIDE 16X22X55MM (Orthopedic Implant) ×3 IMPLANT
COUNTER NEEDLE 20 DBL MAG RED (NEEDLE) ×3 IMPLANT
COVER BACK TABLE 80X110 HD (DRAPES) ×3 IMPLANT
COVER LIGHT HANDLE  DEROYL (MISCELLANEOUS) ×3 IMPLANT
COVER MAYO STAND STRL (DRAPES) ×6 IMPLANT
COVER SURGICAL LIGHT HANDLE (MISCELLANEOUS) ×9 IMPLANT
DECANTER SPIKE VIAL GLASS SM (MISCELLANEOUS) ×9 IMPLANT
DIFFUSER DRILL AIR PNEUMATIC (MISCELLANEOUS) IMPLANT
DRAIN CHANNEL 15F RND FF W/TCR (WOUND CARE) IMPLANT
DRAPE C-ARM 42X72 X-RAY (DRAPES) ×6 IMPLANT
DRAPE C-ARMOR (DRAPES) ×6 IMPLANT
DRAPE INCISE IOBAN 66X45 STRL (DRAPES) ×3 IMPLANT
DRAPE POUCH INSTRU U-SHP 10X18 (DRAPES) ×3 IMPLANT
DRAPE SURG 17X23 STRL (DRAPES) ×24 IMPLANT
DURAPREP 26ML APPLICATOR (WOUND CARE) ×6 IMPLANT
ELECT BLADE 4.0 EZ CLEAN MEGAD (MISCELLANEOUS) ×3
ELECT BLADE 6.5 EXT (BLADE) ×3 IMPLANT
ELECT CAUTERY BLADE 6.4 (BLADE) ×3 IMPLANT
ELECT REM PT RETURN 9FT ADLT (ELECTROSURGICAL) ×3
ELECTRODE BLDE 4.0 EZ CLN MEGD (MISCELLANEOUS) ×2 IMPLANT
ELECTRODE REM PT RTRN 9FT ADLT (ELECTROSURGICAL) ×2 IMPLANT
EVACUATOR SILICONE 100CC (DRAIN) IMPLANT
GAUZE SPONGE 4X4 12PLY STRL (GAUZE/BANDAGES/DRESSINGS) ×6 IMPLANT
GAUZE SPONGE 4X4 16PLY XRAY LF (GAUZE/BANDAGES/DRESSINGS) ×12 IMPLANT
GLOVE BIO SURGEON STRL SZ7 (GLOVE) ×15 IMPLANT
GLOVE BIO SURGEON STRL SZ8 (GLOVE) ×3 IMPLANT
GLOVE BIOGEL PI IND STRL 7.0 (GLOVE) ×2 IMPLANT
GLOVE BIOGEL PI IND STRL 8 (GLOVE) ×6 IMPLANT
GLOVE BIOGEL PI INDICATOR 7.0 (GLOVE) ×1
GLOVE BIOGEL PI INDICATOR 8 (GLOVE) ×3
GLOVE SURG SS PI 6.5 STRL IVOR (GLOVE) ×3 IMPLANT
GOWN STRL REUS W/ TWL LRG LVL3 (GOWN DISPOSABLE) ×6 IMPLANT
GOWN STRL REUS W/ TWL XL LVL3 (GOWN DISPOSABLE) ×4 IMPLANT
GOWN STRL REUS W/TWL LRG LVL3 (GOWN DISPOSABLE) ×3
GOWN STRL REUS W/TWL XL LVL3 (GOWN DISPOSABLE) ×2
GUIDEWIRE BLUNT VIPER II 1.45 (WIRE) ×3 IMPLANT
GUIDEWIRE SHARP VIPER II (WIRE) ×12 IMPLANT
IV CATH 14GX2 1/4 (CATHETERS) ×3 IMPLANT
KIT BASIN OR (CUSTOM PROCEDURE TRAY) ×3 IMPLANT
KIT DILATOR XLIF 5 (KITS) ×2 IMPLANT
KIT POSITION SURG JACKSON T1 (MISCELLANEOUS) ×3 IMPLANT
KIT ROOM TURNOVER OR (KITS) ×3 IMPLANT
KIT SURGICAL ACCESS MAXCESS 4 (KITS) ×3 IMPLANT
KIT XLIF (KITS) ×1
MARKER SKIN DUAL TIP RULER LAB (MISCELLANEOUS) ×3 IMPLANT
MODULE EMG NEEDLE SSEP NVM5 (NEEDLE) ×3 IMPLANT
MODULE NVM5 NEXT GEN EMG (NEEDLE) ×3 IMPLANT
NDL SAFETY ECLIPSE 18X1.5 (NEEDLE) ×2 IMPLANT
NEEDLE 22X1 1/2 (OR ONLY) (NEEDLE) ×3 IMPLANT
NEEDLE HYPO 18GX1.5 SHARP (NEEDLE) ×1
NEEDLE HYPO 25GX1X1/2 BEV (NEEDLE) ×3 IMPLANT
NEEDLE JAMSHIDI VIPER (NEEDLE) ×6 IMPLANT
NEEDLE SPNL 18GX3.5 QUINCKE PK (NEEDLE) ×6 IMPLANT
NS IRRIG 1000ML POUR BTL (IV SOLUTION) ×6 IMPLANT
PACK LAMINECTOMY ORTHO (CUSTOM PROCEDURE TRAY) ×3 IMPLANT
PACK UNIVERSAL I (CUSTOM PROCEDURE TRAY) ×6 IMPLANT
PAD ARMBOARD 7.5X6 YLW CONV (MISCELLANEOUS) ×6 IMPLANT
PATTIES SURGICAL .5 X1 (DISPOSABLE) IMPLANT
PATTIES SURGICAL .5X1.5 (GAUZE/BANDAGES/DRESSINGS) ×3 IMPLANT
PEN SKIN MARKING BROAD (MISCELLANEOUS) ×3 IMPLANT
PENCIL BUTTON HOLSTER BLD 10FT (ELECTRODE) ×3 IMPLANT
PLATE XLIF DECADE 2H SZ16 (Plate) ×3 IMPLANT
PROBE BALL TIP NVM5 SNG USE (BALLOONS) ×3 IMPLANT
ROD PRE LORDOSED VIPER 5.5X50 (Rod) ×3 IMPLANT
ROD PREBENT VIPER 5.5X55MM (Rod) ×3 IMPLANT
SCREW POLY VIPER2 7X40MM (Screw) ×12 IMPLANT
SCREW SET SINGLE INNER MIS (Screw) ×12 IMPLANT
SPONGE GAUZE 4X4 12PLY STER LF (GAUZE/BANDAGES/DRESSINGS) ×6 IMPLANT
SPONGE INTESTINAL PEANUT (DISPOSABLE) ×6 IMPLANT
SPONGE LAP 4X18 X RAY DECT (DISPOSABLE) ×3 IMPLANT
SPONGE SURGIFOAM ABS GEL 100 (HEMOSTASIS) ×3 IMPLANT
STAPLER VISISTAT 35W (STAPLE) ×3 IMPLANT
STRIP CLOSURE SKIN 1/2X4 (GAUZE/BANDAGES/DRESSINGS) ×6 IMPLANT
SURGIFLO W/THROMBIN 8M KIT (HEMOSTASIS) IMPLANT
SUT MNCRL AB 4-0 PS2 18 (SUTURE) ×9 IMPLANT
SUT VIC AB 0 CT1 18XCR BRD 8 (SUTURE) ×4 IMPLANT
SUT VIC AB 0 CT1 8-18 (SUTURE) ×2
SUT VIC AB 1 CT1 18XCR BRD 8 (SUTURE) ×4 IMPLANT
SUT VIC AB 1 CT1 8-18 (SUTURE) ×2
SUT VIC AB 2-0 CT2 18 VCP726D (SUTURE) ×6 IMPLANT
SYR 20CC LL (SYRINGE) ×6 IMPLANT
SYR BULB IRRIGATION 50ML (SYRINGE) ×3 IMPLANT
SYR CONTROL 10ML LL (SYRINGE) ×9 IMPLANT
SYR TB 1ML LUER SLIP (SYRINGE) ×3 IMPLANT
TAP CANN VIPER2 DL 6.0 (TAP) ×6 IMPLANT
TAPE CLOTH SURG 4X10 WHT LF (GAUZE/BANDAGES/DRESSINGS) ×6 IMPLANT
TOWEL OR 17X24 6PK STRL BLUE (TOWEL DISPOSABLE) ×3 IMPLANT
TOWEL OR 17X26 10 PK STRL BLUE (TOWEL DISPOSABLE) ×3 IMPLANT
TRAY FOLEY CATH 16FR SILVER (SET/KITS/TRAYS/PACK) ×3 IMPLANT
TRAY FOLEY W/METER SILVER 16FR (SET/KITS/TRAYS/PACK) ×3 IMPLANT
TUBE CONNECTING 12X1/4 (SUCTIONS) ×3 IMPLANT
WATER STERILE IRR 1000ML POUR (IV SOLUTION) ×3 IMPLANT
YANKAUER SUCT BULB TIP NO VENT (SUCTIONS) ×6 IMPLANT

## 2016-07-03 NOTE — Op Note (Signed)
NAME:  George Aguirre, Larson                ACCOUNT NO.:  [id number]  MEDICAL RECORD NO.:  [id number]  LOCATION:  [facility]                         FACILITY:  [facility]  PHYSICIAN:  Estill BambergMark Deontae Robson, MD      DATE OF BIRTH:  01/21/1956  DATE OF PROCEDURE:  07/03/2016                              OPERATIVE REPORT   PREOPERATIVE DIAGNOSES: 1. Status post previous L4-5 decompression x2 with residual ongoing     right greater than left leg pain. 2. Granulation tissue in the region of the L4-5 intervertebral space     with degenerative disk disease identified at L4-5.  POSTOPERATIVE DIAGNOSES: 1. Status post previous L4-5 decompression x2 with residual ongoing     right greater than left leg pain. 2. Granulation tissue in the region of the L4-5 intervertebral space     with degenerative disk disease identified at L4-5.  PROCEDURES: 1. Anterior lumbar interbody fusion, L4-5, via a direct right-sided     lateral approach. 2. Insertion of interbody device x1 (14 x 22 x 60 mm NuVasive     intervertebral spacer). 3. Placement of anterior instrumentation, L4-5, (2-hole NuVasive     lateral plate). 4. Intraoperative use of fluoroscopy. 5. Posterior spinal fusion, L4-5. 6. Placement of posterior instrumentation, L4, L5. 7. Use of morselized allograft - ViviGen.  SURGEON:  Estill BambergMark Ahliya Glatt, MD.  ASSISTANJason Coop:  Kayla McKenzie, PA-C.  ANESTHESIA:  General endotracheal anesthesia.  COMPLICATIONS:  None.  DISPOSITION:  Stable.  ESTIMATED BLOOD LOSS:  Minimal.  INDICATIONS FOR SURGERY:  Briefly, Mr. George Aguirre is a pleasant 61 year old male who did present to me status post 2 previous decompressions.  After his most recent surgery, he did gain 2 months of relief, but did have recurrence of pain.  He did feel that his pain was rather debilitating. His previous surgeon did discuss proceeding with a fusion procedure. The surgeon then moved to a different location and was not able to proceed with his procedure.  I did  evaluate the patient and I did agree that it was reasonable to proceed with a fusion procedure as outlined above.  The patient was fully aware that the surgery may or may not address his leg pain.  He did elect to proceed.  OPERATIVE DETAILS:  On 07/03/2016, patient was brought to surgery and general endotracheal anesthesia was administered.  The patient was placed in the lateral decubitus position with the right side up.  All bony prominences were padded.  An axillary roll was placed.  The patient's hips and knees were flexed.  The region of the right flank was prepped and draped in the usual fashion.  The bed was flexed in order to optimize visualization of the L4-5 intervertebral space.  I then made a small transverse incision overlying the L4-5 intervertebral space.  I then dissected through the external, internal, and transverse oblique musculature.  The retroperitoneal space was entered.  I then docked the dilator overlying the L4-5 intervertebral space.  Neurologic monitoring did confirm that there were no neurologic structures in the immediate vicinity of the dilator.  I then sequentially dilated and placed a self- retaining retractor.  I did probe the wound with neurologic monitoring and  there were no neurologic structures in the immediate vicinity.  I then proceeded with a thorough and complete L4-5 intervertebral diskectomy.  The contralateral annulus was released.  I then placed a series of trials and I did feel that a 14 x 22 x 60 mm intervertebral spacer would be the most appropriate fit.  The appropriate size interbody spacer was then packed with ViviGen and tamped into position. A 2-hole NuVasive plate was then placed over the lateral aspect of the right side of the spine.  45 mm screws were placed and locked to the plate.  The bed was then flattened and the plate was locked.  I was very pleased with the final AP and lateral fluoroscopic images.  The wound was then  copiously irrigated and closed in layers using 0 Vicryl followed by 2-0 Vicryl followed by 4-0 Monocryl.  A sterile dressing was then placed and the patient was then placed prone onto a Jackson spinal frame.  The back was then prepped and draped and a time-out procedure was again performed.  I then made 2 paramedian incisions just lateral to the L4 and L5 pedicles on the right and left sides.  Jamshidi's were advanced across the L4 and L5 pedicles.  On the left side, I did identify the left-sided L4-5 facet joint and posterolateral gutter, which was decorticated using a high-speed bur.  ViviGen was then packed to the posterolateral gutter on the left side to help aid in the success of the fusion.  I did liberally use AP and lateral fluoroscopy while advancing the Jamshidi's on both the right and left sides.  I then used a 6 mm tap over the guidewires.  I then used neurologic monitoring to test each of the taps, and there was no tap that tested below 20 milliamps.  I then placed 7 x 40 mm screws bilaterally at L4 and at L5. On the right side, a 50 mm rod was secured into the tulip heads of the screws.  On the left, a 55 mm rod was placed.  Caps were then placed and a final locking procedure was performed bilaterally.  I was very pleased with the final AP and lateral fluoroscopic images.  The wounds were then copiously irrigated.  The fascia was then closed using #1 Vicryl, followed by 2-0 Vicryl, followed by 4-0 Monocryl.  Benzoin and Steri- Strips were applied followed by sterile dressing.  All instrument counts were correct at the termination of the procedure.  Of note, Jason Coop was my assistant throughout surgery, and did aid in retraction, suctioning, and closure from start to finish.     Estill Bamberg, MD     MD/MEDQ  D:  07/03/2016  T:  07/03/2016  Job:  [id number]

## 2016-07-03 NOTE — Evaluation (Signed)
Physical Therapy Evaluation Patient Details Name: George GhaziRobert A Bassette MRN: [id number] DOB: [birth year] Today's Date: 07/03/2016   History of Present Illness  Pt is a 61 yo male admitted on 07/03/16 for L4-L5 lumbar fusion. PMH is signficant for L4-L5 decompression, GERD, HTN.   Clinical Impression  Pt is POD 0 and moving slowly with therapy. Prior to admission, pt was completely independent and working part time. Pt requires Min A for majority of mobility this session and requires cues for precautions and bed mobility. Pt will benefit from continued acute PT services in order to improve below deficits to assist with a smooth transition home.     Follow Up Recommendations No PT follow up    Equipment Recommendations  Rolling walker with 5" wheels;3in1 (PT)    Recommendations for Other Services       Precautions / Restrictions Precautions Precautions: Back Precaution Booklet Issued: No Precaution Comments: Verbally reviewed precautions Required Braces or Orthoses: Spinal Brace Spinal Brace: Thoracolumbosacral orthotic;Applied in sitting position;Applied in standing position Restrictions Weight Bearing Restrictions: No      Mobility  Bed Mobility Overal bed mobility: Needs Assistance Bed Mobility: Sit to Supine;Rolling;Sidelying to Sit;Sit to Sidelying Rolling: Min assist Sidelying to sit: Mod assist     Sit to sidelying: Min assist General bed mobility comments: Cues for log roll technique and requires assistance at trunk to come upright at EOB after rolling.   Transfers Overall transfer level: Needs assistance Equipment used: Rolling walker (2 wheeled) Transfers: Sit to/from Stand Sit to Stand: Min assist         General transfer comment: Min A for safety from EOB  Ambulation/Gait Ambulation/Gait assistance: Min assist Ambulation Distance (Feet): 10 Feet (5' forward and 5' backward) Assistive device: Rolling walker (2 wheeled) Gait Pattern/deviations: Step-to  pattern;Antalgic Gait velocity: decreased Gait velocity interpretation: Below normal speed for age/gender General Gait Details: Mild antalgic gait, decreased step length and increased fatigue with short distance gait.   Stairs            Wheelchair Mobility    Modified Rankin (Stroke Patients Only)       Balance                                             Pertinent Vitals/Pain Pain Assessment: 0-10 Pain Score: 7  Pain Location: lumbar region Pain Descriptors / Indicators: Aching;Guarding;Grimacing;Sore Pain Intervention(s): Limited activity within patient's tolerance;Monitored during session;Premedicated before session;Repositioned;Patient requesting pain meds-RN notified    Home Living Family/patient expects to be discharged to:: Private residence Living Arrangements: Spouse/significant other Available Help at Discharge: Family;Available 24 hours/day Type of Home: House Home Access: Stairs to enter Entrance Stairs-Rails: None Entrance Stairs-Number of Steps: 3-4 Home Layout: One level Home Equipment: None      Prior Function Level of Independence: Independent         Comments: 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     [employer]   Dominant Hand: Right    Extremity/Trunk Assessment   Upper Extremity Assessment Upper Extremity Assessment: Defer to OT evaluation    Lower Extremity Assessment Lower Extremity Assessment: Generalized weakness       Communication   Communication: No difficulties  Cognition Arousal/Alertness: Lethargic;Suspect due to medications Behavior During Therapy: [facility] for tasks assessed/performed Overall Cognitive Status: Within Functional Limits for tasks assessed  General Comments      Exercises     Assessment/Plan    PT Assessment Patient needs continued PT services  PT Problem List Decreased strength;Decreased activity tolerance;Decreased balance;Decreased  mobility;Decreased knowledge of use of DME;Pain       PT Treatment Interventions DME instruction;Gait training;Stair training;Functional mobility training;Therapeutic activities;Therapeutic exercise;Balance training;Patient/family education    PT Goals (Current goals can be found in the Care Plan section)  Acute Rehab PT Goals Patient Stated Goal: to get home and do what he can for his back PT Goal Formulation: With patient Time For Goal Achievement: 07/10/16 Potential to Achieve Goals: Good    Frequency Min 5X/week   Barriers to discharge        Co-evaluation               End of Session Equipment Utilized During Treatment: Gait belt;Back brace Activity Tolerance: Patient tolerated treatment well;Patient limited by lethargy Patient left: in bed;with call bell/phone within reach;with family/visitor present;with SCD's reapplied Nurse Communication: Mobility status;Patient requests pain meds PT Visit Diagnosis: Difficulty in walking, not elsewhere classified (R26.2);Pain Pain - Right/Left:  (lumbar region) Pain - part of body:  (lumbar region)         Time: 9604-5409 PT Time Calculation (min) (ACUTE ONLY): 32 min   Charges:   PT Evaluation $PT Eval Moderate Complexity: 1 Procedure PT Treatments $Therapeutic Activity: 8-22 mins   PT G Codes:         Colin Broach PT, DPT  [phone]  07/03/2016, 5:07 PM

## 2016-07-03 NOTE — H&P (Signed)
PREOPERATIVE H&P  Chief Complaint: Bilateral leg pain  HPI: George Aguirre is a 61 y.o. male who presents with ongoing pain in the bilateral legs, R > L. Patient is s/p 2 previous decompressions at the L4/5 level. Pain is most notable with standing and walking.   MRI reveals granulation tissue bilaterally at L4/5  Patient has failed multiple forms of conservative care and continues to have pain (see office notes for additional details regarding the patient's full course of treatment)  Past Medical History:  Diagnosis Date  . Complication of anesthesia    had problem with ketamine (hard to wake up and unable to breath, re-intubated after first back surgery)  . GERD (gastroesophageal reflux disease)   . Hypertension    Past Surgical History:  Procedure Laterality Date  . ANKLE SURGERY     left ankle, growth removal  . BACK SURGERY    . TONSILLECTOMY    . WISDOM TOOTH EXTRACTION     Social History   Social History  . Marital status: Married    Spouse name: N/A  . Number of children: N/A  . Years of education: N/A   Social History Main Topics  . Smoking status: Never Smoker  . Smokeless tobacco: Never Used  . Alcohol use No  . Drug use: No  . Sexual activity: Not Asked   Other Topics Concern  . None   Social History Narrative  . None   History reviewed. No pertinent family history. Allergies  Allergen Reactions  . Ketamine Other (See Comments)    Unsure if allergic to this, unable to wake up after first back surgery and trouble breathing  . Penicillins Swelling    Has patient had a PCN reaction causing immediate rash, facial/tongue/throat swelling, SOB or lightheadedness with hypotension:  UNKNOWN  Has patient had a PCN reaction causing severe rash involving mucus membranes or skin necrosis: unknown Has patient had a PCN reaction that required hospitalization No Has patient had a PCN reaction occurring within the last 10 years: No If all of the above  answers are "NO", then may proceed with Cephalosporin use.    Prior to Admission medications   Medication Sig Start Date End Date Taking? Authorizing Provider  amLODipine-benazepril (LOTREL) 5-20 MG capsule Take 1 capsule by mouth daily.   Yes Historical Provider, MD  aspirin EC 81 MG tablet Take 81 mg by mouth daily. On hold for the procedure   Yes Historical Provider, MD  Cholecalciferol (VITAMIN D) 2000 units CAPS Take 2,000 Units by mouth daily. On hold for the procedure   Yes Historical Provider, MD  COD LIVER OIL PO Take 315 mg by mouth daily. On hold for the procedure   Yes Historical Provider, MD  HYDROcodone-acetaminophen (NORCO/VICODIN) 5-325 MG tablet Take 1 tablet by mouth at bedtime.   Yes Historical Provider, MD  Magnesium ([facility] MAGNESIUM) 250 MG TABS Take 250 mg by mouth daily.   Yes Historical Provider, MD  Multiple Vitamin (MULTIVITAMIN WITH MINERALS) TABS tablet Take 1 tablet by mouth daily. On hold for the procedure   Yes Historical Provider, MD  naproxen sodium (ANAPROX) 220 MG tablet Take 220 mg by mouth 3 (three) times daily as needed (pain). On hold for the procedure   Yes Historical Provider, MD  Probiotic Product (PHILLIPS COLON HEALTH) CAPS Take 1 capsule by mouth daily. On hold for the procedure   Yes Historical Provider, MD     All other systems have been reviewed and  were otherwise negative with the exception of those mentioned in the HPI and as above.  Physical Exam: Vitals:   07/03/16 0605  BP: 124/72  Pulse: 74  Resp: 20  Temp: 98.2 F (36.8 C)    General: Alert, no acute distress Cardiovascular: No pedal edema Respiratory: No cyanosis, no use of accessory musculature Skin: No lesions in the area of chief complaint Neurologic: Sensation intact distally Psychiatric: Patient is competent for consent with normal mood and affect Lymphatic: No axillary or cervical lymphadenopathy   Assessment/Plan: Bilateral leg pain Plan for Procedure(s): RIGHT  SIDED LUMBAR 4-5 LATERAL INTERBODY FUSION WITH INSTRUMENTATION AND ALLOGRAFT LUMBAR 4-5 POSTERIOR LUMBAR FUSION WITH INSTRUMENTATION AND ALLOGRAFT   Emilee HeroUMONSKI,Hildagard Sobecki LEONARD, MD 07/03/2016 6:53 AM

## 2016-07-03 NOTE — Anesthesia Procedure Notes (Signed)
Procedure Name: Intubation Date/Time: 07/03/2016 7:43 AM Performed by: Lovie CholOCK, Gloyd Happ K Pre-anesthesia Checklist: Patient identified, Emergency Drugs available, Suction available, Patient being monitored and Timeout performed Patient Re-evaluated:Patient Re-evaluated prior to inductionOxygen Delivery Method: Circle system utilized Preoxygenation: Pre-oxygenation with 100% oxygen Intubation Type: IV induction Ventilation: Mask ventilation without difficulty Grade View: Grade II Tube type: Oral Tube size: 7.5 mm Number of attempts: 1 Airway Equipment and Method: Video-laryngoscopy and Rigid stylet Placement Confirmation: ETT inserted through vocal cords under direct vision,  positive ETCO2 and breath sounds checked- equal and bilateral Secured at: 22 cm Tube secured with: Tape Dental Injury: Teeth and Oropharynx as per pre-operative assessment  Difficulty Due To: Difficulty was anticipated and Difficult Airway- due to limited oral opening Future Recommendations: Recommend- induction with short-acting agent, and alternative techniques readily available

## 2016-07-03 NOTE — Anesthesia Postprocedure Evaluation (Addendum)
Anesthesia Post Note  Patient: Manuella GhaziRobert A Venters  Procedure(s) Performed: Procedure(s) (LRB): RIGHT SIDED LUMBAR 4-5 LATERAL INTERBODY FUSION WITH INSTRUMENTATION AND ALLOGRAFT (Right) LUMBAR 4-5 POSTERIOR LUMBAR FUSION WITH INSTRUMENTATION AND ALLOGRAFT (N/A)  Patient location during evaluation: PACU Anesthesia Type: General Level of consciousness: awake and alert Pain management: pain level controlled Vital Signs Assessment: post-procedure vital signs reviewed and stable Respiratory status: spontaneous breathing, nonlabored ventilation, respiratory function stable and patient connected to nasal cannula oxygen Cardiovascular status: blood pressure returned to baseline and stable Postop Assessment: no signs of nausea or vomiting Anesthetic complications: no       Last Vitals:  Vitals:   07/03/16 1345 07/03/16 1400  BP:  104/64  Pulse: 65 64  Resp: 18 17  Temp: 36.7 C 36.7 C    Last Pain:  Vitals:   07/03/16 1315  TempSrc:   PainSc: Asleep                 Shelton SilvasKevin D Thurman Sarver

## 2016-07-03 NOTE — Progress Notes (Signed)
Pharmacy Antibiotic Note  George GhaziRobert A Aguirre is a 61 y.o. male admitted on 07/03/2016 with back pain. Pharmacy has been consulted for vancomycin dosing for surgical prophylaxis. .  Plan: Vancomycin 1500mg  IV once post surgery - NO DRAINS in place    Temp (24hrs), Avg:98.1 F (36.7 C), Min:98 F (36.7 C), Max:98.2 F (36.8 C)  No results for input(s): WBC, CREATININE, LATICACIDVEN, VANCOTROUGH, VANCOPEAK, VANCORANDOM, GENTTROUGH, GENTPEAK, GENTRANDOM, TOBRATROUGH, TOBRAPEAK, TOBRARND, AMIKACINPEAK, AMIKACINTROU, AMIKACIN in the last 168 hours.  Estimated Creatinine Clearance: 71 mL/min (by C-G formula based on SCr of 1.37 mg/dL (H)).    Allergies  Allergen Reactions  . Ketamine Other (See Comments)    Unsure if allergic to this, unable to wake up after first back surgery and trouble breathing  . Penicillins Swelling    Has patient had a PCN reaction causing immediate rash, facial/tongue/throat swelling, SOB or lightheadedness with hypotension:  UNKNOWN  Has patient had a PCN reaction causing severe rash involving mucus membranes or skin necrosis: unknown Has patient had a PCN reaction that required hospitalization No Has patient had a PCN reaction occurring within the last 10 years: No If all of the above answers are "NO", then may proceed with Cephalosporin use.     Thank you for allowing pharmacy to be a part of this patient's care.  George Aguirre 07/03/2016 3:23 PM

## 2016-07-03 NOTE — Transfer of Care (Signed)
Immediate Anesthesia Transfer of Care Note  Patient: Manuella Ghaziobert A Randleman  Procedure(s) Performed: Procedure(s) with comments: RIGHT SIDED LUMBAR 4-5 LATERAL INTERBODY FUSION WITH INSTRUMENTATION AND ALLOGRAFT (Right) - RIGHT SIDED LUMBAR 4-5 LATERAL INTERBODY FUSION WITH INSTRUMENTATION AND ALLOGRAFT LUMBAR 4-5 POSTERIOR LUMBAR FUSION WITH INSTRUMENTATION AND ALLOGRAFT (N/A) - LUMBAR 4-5 POSTERIOR LUMBAR FUSION WITH INSTRUMENTATION AND ALLOGRAFT  Patient Location: PACU  Anesthesia Type:General  Level of Consciousness: awake, oriented and patient cooperative  Airway & Oxygen Therapy: Patient Spontanous Breathing and Patient connected to face mask oxygen  Post-op Assessment: Report given to RN and Post -op Vital signs reviewed and stable  Post vital signs: Reviewed  Last Vitals:  Vitals:   07/03/16 0605 07/03/16 1229  BP: 124/72   Pulse: 74 (P) 64  Resp: 20 (P) 14  Temp: 36.8 C     Last Pain:  Vitals:   07/03/16 0605  TempSrc: Oral  PainSc:          Complications: No apparent anesthesia complications

## 2016-07-04 ENCOUNTER — Encounter (HOSPITAL_COMMUNITY): Payer: Self-pay | Admitting: Orthopedic Surgery

## 2016-07-04 MED FILL — Thrombin For Soln 20000 Unit: CUTANEOUS | Qty: 1 | Status: AC

## 2016-07-04 NOTE — Progress Notes (Signed)
Discharge instructions printed and reviewed with patient and spouse, and copy given for them to take home. All questions addressed at this time. New prescriptions reviewed and given to the patient to be filled at his usual outpatient pharmacy. IV removed. Room searched for patient belongings, and confirmed with patient that all valuables were accounted for. Spouse assisted patient to dress and apply back brace. Staff to escort patient to discharge via wheelchair.

## 2016-07-04 NOTE — Progress Notes (Signed)
Physical Therapy Treatment Patient Details Name: George GhaziRobert A Perkin MRN: [id number] DOB: [birth year] Today's Date: 07/04/2016    History of Present Illness Pt is a 61 yo male admitted on 07/03/16 for L4-L5 lumbar fusion. PMH is signficant for L4-L5 decompression, GERD, HTN.     PT Comments    Pt is POD 1 following the above procedure and moving better with therapy. Pt's wife assists with donning brace correctly and with positioning pt in bed and chair. Pt instructed to perform gait at least 1x an hour with wife's assistance. Improved tolerance for gait distance noted this session. Pt will benefit from an additional therapy visit in order to review stair negotiation prior to discharge.     Follow Up Recommendations  No PT follow up     Equipment Recommendations  Rolling walker with 5" wheels;3in1 (PT)    Recommendations for Other Services       Precautions / Restrictions Precautions Precautions: Back Precaution Booklet Issued: Yes (comment) Precaution Comments: Verbally reviewed precautions Required Braces or Orthoses: Spinal Brace Spinal Brace: Thoracolumbosacral orthotic;Applied in sitting position;Applied in standing position Restrictions Weight Bearing Restrictions: No    Mobility  Bed Mobility Overal bed mobility: Needs Assistance Bed Mobility: Rolling;Sidelying to Sit Rolling: Min guard Sidelying to sit: Min guard       General bed mobility comments: Min guard for safety with log roll and sidelying to sit. Pt is able to sit using bed rail.   Transfers Overall transfer level: Needs assistance Equipment used: Rolling walker (2 wheeled) Transfers: Sit to/from Stand Sit to Stand: Min assist         General transfer comment: Min A for safety from EOB  Ambulation/Gait Ambulation/Gait assistance: Min assist;Min guard Ambulation Distance (Feet): 150 Feet Assistive device: Rolling walker (2 wheeled) Gait Pattern/deviations: Antalgic;Step-to pattern Gait velocity:  decreased Gait velocity interpretation: Below normal speed for age/gender General Gait Details: Mild antalgic gait, step through sequencing. Gait improved from Min A to Min guard with increased distance.    Stairs            Wheelchair Mobility    Modified Rankin (Stroke Patients Only)       Balance Overall balance assessment: Needs assistance Sitting-balance support: No upper extremity supported;Feet supported Sitting balance-Leahy Scale: Fair     Standing balance support: Bilateral upper extremity supported Standing balance-Leahy Scale: Poor Standing balance comment: relies on RW for stability during gait and standing activities                    Cognition Arousal/Alertness: Awake/alert Behavior During Therapy: WFL for tasks assessed/performed Overall Cognitive Status: Within Functional Limits for tasks assessed                      Exercises      General Comments        Pertinent Vitals/Pain Pain Assessment: 0-10 Pain Score: 4  Pain Location: lumbar region Pain Descriptors / Indicators: Aching;Guarding;Grimacing;Sore Pain Intervention(s): Monitored during session;Premedicated before session;Repositioned;RN gave pain meds during session    Home Living                      Prior Function            PT Goals (current goals can now be found in the care plan section) Acute Rehab PT Goals Patient Stated Goal: to get home and do what he can for his back Progress towards PT goals: Progressing toward goals  Frequency    Min 5X/week      PT Plan Current plan remains appropriate    Co-evaluation             End of Session Equipment Utilized During Treatment: Gait belt;Back brace Activity Tolerance: Patient tolerated treatment well Patient left: in chair;with call bell/phone within reach;with family/visitor present Nurse Communication: Mobility status PT Visit Diagnosis: Difficulty in walking, not elsewhere  classified (R26.2);Pain Pain - Right/Left: Right Pain - part of body:  (Lumbar region)     Time: 1610-9604 PT Time Calculation (min) (ACUTE ONLY): 34 min  Charges:  $Gait Training: 8-22 mins $Therapeutic Activity: 8-22 mins                    G Codes:       Colin Broach PT, DPT  [phone]  07/04/2016, 10:09 AM

## 2016-07-04 NOTE — Evaluation (Signed)
Occupational Therapy Evaluation Patient Details Name: George Aguirre MRN: [id number] DOB: 1956-04-10 Today's Date: 07/04/2016    History of Present Illness Pt is a 61 yo male admitted on 07/03/16 for L4-L5 lumbar fusion. PMH is signficant for L4-L5 decompression, GERD, HTN.    Clinical Impression   Pt requires mod A with LB ADLs and min guard A with ADL mobility using RW. Pt's wife will provide 24/7 assist prn. Pt and his wife educated on ADL A/E and bathroom DME for home use. All education completed and no further acute OT indicated at this time    Follow Up Recommendations  No OT follow up;Supervision - Intermittent    Equipment Recommendations  3 in 1 bedside commode;Tub/shower bench;Other (comment) (ADL A/E kit)    Recommendations for Other Services       Precautions / Restrictions Precautions Precautions: Back Precaution Booklet Issued: Yes (comment) Precaution Comments: pt recalled 2/3 back precautions. reviewed all back precautions with pt and his wife Required Braces or Orthoses: Spinal Brace Spinal Brace: Thoracolumbosacral orthotic;Applied in sitting position;Applied in standing position Restrictions Weight Bearing Restrictions: No      Mobility Bed Mobility Overal bed mobility: Needs Assistance Bed Mobility: Rolling;Sidelying to Sit Rolling: Min guard Sidelying to sit: Min guard       General bed mobility comments: pt up in recliner upon arrival  Transfers Overall transfer level: Needs assistance Equipment used: Rolling walker (2 wheeled) Transfers: Sit to/from Stand Sit to Stand: Min guard         General transfer comment: Min A for safety from EOB    Balance Overall balance assessment: Needs assistance Sitting-balance support: No upper extremity supported;Feet supported Sitting balance-Leahy Scale: Fair     Standing balance support: Bilateral upper extremity supported;Single extremity supported;During functional activity Standing balance-Leahy  Scale: Poor Standing balance comment: relies on RW for stability during gait and standing activities                            ADL Overall ADL's : Needs assistance/impaired     Grooming: Wash/dry hands;Wash/dry face;Standing;Min guard   Upper Body Bathing: Set up;Sitting   Lower Body Bathing: Moderate assistance;With caregiver independent assisting   Upper Body Dressing : Set up;Sitting   Lower Body Dressing: Moderate assistance;With caregiver independent assisting   Toilet Transfer: RW;Ambulation;Comfort height toilet;Min guard   Toileting- Clothing Manipulation and Hygiene: Minimal assistance;Sit to/from stand;With caregiver independent assisting   Tub/ Shower Transfer: Min guard;3 in Wink walker;Ambulation;With caregiver independent assisting   Functional mobility during ADLs: Min guard;Caregiver able to provide necessary level of assistance;Rolling walker General ADL Comments: Pt and wife educated on ADL A/E and DME for home use     Vision   Vision Assessment?: No apparent visual deficits                Pertinent Vitals/Pain Pain Assessment: 0-10 Pain Score: 3  Pain Location: back Pain Descriptors / Indicators: Aching;Sore Pain Intervention(s): Monitored during session;Premedicated before session     Hand Dominance Right   Extremity/Trunk Assessment  WFL           Communication Communication Communication: No difficulties   Cognition Arousal/Alertness: Awake/alert Behavior During Therapy: WFL for tasks assessed/performed Overall Cognitive Status: Within Functional Limits for tasks assessed                     General Comments   pt pleasant and cooperative  Home Living Family/patient expects to be discharged to:: Private residence Living Arrangements: Spouse/significant other Available Help at Discharge: Family;Available 24 hours/day Type of Home: House Home Access: Stairs to enter [employer] of Steps: 4 Entrance Stairs-Rails: None Home Layout: One level     Bathroom Shower/Tub: Teacher, early years/pre: Standard     Home Equipment: None          Prior Functioning/Environment Level of Independence: Independent        Comments: 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                      OT Goals(Current goals can be found in the care plan section) Acute Rehab OT Goals Patient Stated Goal: to get home and do what he can for his back OT Goal Formulation: With patient/family  OT Frequency:     Barriers to D/C:    no barriers                     End of Session Equipment Utilized During Treatment: Other (comment);Rolling walker;Back brace (3 in 1)  Activity Tolerance: Patient tolerated treatment well Patient left: with family/visitor present (ambulating  with wife in hallway)  OT Visit Diagnosis: Pain;Other abnormalities of gait and mobility (R26.89) Pain - part of body:  (low back)                ADL either performed or assessed with clinical judgement  Time: 1006-1031 OT Time Calculation (min): 25 min Charges:  OT General Charges $OT Visit: 1 Procedure OT Evaluation $OT Eval Low Complexity: 1 Procedure OT Treatments $Therapeutic Activity: 8-22 mins G-Codes: OT G-codes **NOT FOR INPATIENT CLASS** Functional Assessment Tool Used: AM-PAC 6 Clicks Daily Activity     Britt Bottom 07/04/2016, 11:59 AM

## 2016-07-04 NOTE — Progress Notes (Signed)
Physical Therapy Treatment Note:  Clinical Impression: Pt with improved tolerance for gait activities. Pt is able to perform stair negotiation x 3 steps to mimic going home. Improved tolerance for upright posture with gait and decreased c/o fatigue in LE's following. Encouraged pt to continue walking at home at least 1x an hour in order to maximize his functional outcomes.     07/04/16 1526  PT Visit Information  Last PT Received On 07/04/16  Assistance Needed +1  History of Present Illness Pt is a 61 yo male admitted on 07/03/16 for L4-L5 lumbar fusion. PMH is signficant for L4-L5 decompression, GERD, HTN.   Subjective Data  Subjective Pt states that he is doing well and is ready to go home.   Patient Stated Goal to get home and do what he can for his back  Precautions  Precautions Back  Precaution Booklet Issued Yes (comment)  Precaution Comments pt recalled 2/3 back precautions. reviewed all back precautions with pt and his wife  Required Braces or Orthoses Spinal Brace  Spinal Brace TLSO;Applied in sitting position;Applied in standing position  Restrictions  Weight Bearing Restrictions No  Cognition  Arousal/Alertness Awake/alert  Behavior During Therapy WFL for tasks assessed/performed  Overall Cognitive Status Within Functional Limits for tasks assessed  Bed Mobility  General bed mobility comments pt up in recliner upon arrival  Transfers  Overall transfer level Needs assistance  Equipment used Rolling walker (2 wheeled)  Transfers Sit to/from Stand  Sit to Stand Min guard  General transfer comment Min A for safety from EOB  Ambulation/Gait  Ambulation/Gait assistance Min guard  Ambulation Distance (Feet) 150 Feet  Assistive device Rolling walker (2 wheeled)  Gait Pattern/deviations Antalgic;Step-to pattern  General Gait Details Mild antalgic gait, step through sequencing. Gait improved from Min A to Min guard with increased distance.   Gait velocity decreased  Gait  velocity interpretation Below normal speed for age/gender  Stairs Yes  Stairs assistance Min guard  Stair Management One rail Left;Alternating pattern;Forwards  Number of Stairs 3  General stair comments Cues for sequencing, good tolerance  PT - End of Session  Equipment Utilized During Treatment Gait belt;Back brace  Activity Tolerance Patient tolerated treatment well  Patient left in chair;with call bell/phone within reach;with family/visitor present  Nurse Communication Mobility status  PT - Assessment/Plan  PT Plan Current plan remains appropriate  PT Visit Diagnosis Difficulty in walking, not elsewhere classified (R26.2);Pain  Pain - Right/Left Right  Pain - part of body (Lumbar region)  PT Frequency (ACUTE ONLY) Min 5X/week  Follow Up Recommendations No PT follow up  PT equipment Rolling walker with 5" wheels;3in1 (PT)  AM-PAC PT "6 Clicks" Daily Activity Outcome Measure  Difficulty turning over in bed (including adjusting bedclothes, sheets and blankets)? 3  Difficulty moving from lying on back to sitting on the side of the bed?  3  Difficulty sitting down on and standing up from a chair with arms (e.g., wheelchair, bedside commode, etc,.)? 3  Help needed moving to and from a bed to chair (including a wheelchair)? 3  Help needed walking in hospital room? 3  Help needed climbing 3-5 steps with a railing?  3  6 Click Score 18  Mobility G Code  CK  PT Goal Progression  Progress towards PT goals Progressing toward goals  PT Time Calculation  PT Start Time (ACUTE ONLY) 1335  PT Stop Time (ACUTE ONLY) 1356  PT Time Calculation (min) (ACUTE ONLY) 21 min  PT General Charges  $$  ACUTE PT VISIT 1 Procedure  PT Treatments  $Gait Training 8-22 mins   Colin Broach PT, DPT  [phone]

## 2016-07-04 NOTE — Care Management Note (Signed)
Case Management Note  Patient Details  Name: George GhaziRobert A Aguirre MRN: [id number] Date of Birth: 1956-01-08  Subjective/Objective:     61 yr old gentleman s/p L4-5 fusion.          Action/Plan:  Patient has no HH therapy needs but will need DME. Patient is under worker's comp. CM called patient's adjuster- George Landngela [phone], she stated that George Aguirre from [employer] can contact her for authorization for DME. CM contacted George Aguirre with [facility] and asked him to contact George Aguirre. Patient will have family support at discharge.   Expected Discharge Date:   07/04/16                Expected Discharge Plan:   Home/self care  In-House Referral:  NA  Discharge planning Services  CM Consult  Post Acute Care Choice:  Durable Medical Equipment Choice offered to:   (CM contacted adjuster)  DME Arranged:  3-N-1, Walker rolling DME Agency:  [facility]  HH Arranged:  NA HH Agency:  NA  Status of Service:   in process  If discussed at Long Length of Stay Meetings, dates discussed:    Additional Comments:  George Aguirre, George Schlauch Naomi, RN 07/04/2016, 2:05 PM

## 2016-07-04 NOTE — Progress Notes (Signed)
    Patient doing well Has been ambulating Leg pain improved   Physical Exam: Vitals:   07/03/16 1958 07/04/16 0435  BP: 114/68 114/66  Pulse: 87 71  Resp: 17   Temp: 98.2 F (36.8 C) 98 F (36.7 C)    Dressing in place NVI  POD #1 s/p L4/5 lateral/posterior fusion, doing well  - up with PT/OT, encourage ambulation - Percocet for pain, Valium for muscle spasms - likely d/c home later today with follow-up in 2 weeks - patient may need walker for d/c home

## 2016-07-17 NOTE — Discharge Summary (Signed)
Patient ID: TYLEE NEWBY MRN: [id number] DOB/AGE: 10/11/1955 61 y.o.  Admit date: 07/03/2016 Discharge date: 07/04/2016  Admission Diagnoses:  Active Problems:   Radiculopathy   Discharge Diagnoses:  Same  Past Medical History:  Diagnosis Date  . Complication of anesthesia    had problem with ketamine (hard to wake up and unable to breath, re-intubated after first back surgery)  . GERD (gastroesophageal reflux disease)   . Hypertension     Surgeries: Procedure(s): RIGHT SIDED LUMBAR 4-5 LATERAL INTERBODY FUSION WITH INSTRUMENTATION AND ALLOGRAFT LUMBAR 4-5 POSTERIOR LUMBAR FUSION WITH INSTRUMENTATION AND ALLOGRAFT on 07/03/2016   Consultants: None  Discharged Condition: Improved  Hospital Course: FREDERIK STANDLEY is an 61 y.o. male who was admitted 07/03/2016 for operative treatment of spinal stenosis. Patient has severe unremitting pain that affects sleep, daily activities, and work/hobbies. After pre-op clearance the patient was taken to the operating room on 07/03/2016 and underwent  Procedure(s): RIGHT SIDED LUMBAR 4-5 LATERAL INTERBODY FUSION WITH INSTRUMENTATION AND ALLOGRAFT LUMBAR 4-5 POSTERIOR LUMBAR FUSION WITH INSTRUMENTATION AND ALLOGRAFT.    Patient was given perioperative antibiotics:  Anti-infectives    Start     Dose/Rate Route Frequency Ordered Stop   07/03/16 2000  vancomycin (VANCOCIN) 1,500 mg in sodium chloride 0.9 % 500 mL IVPB     1,500 mg 250 mL/hr over 120 Minutes Intravenous  Once 07/03/16 1522 07/03/16 2331   07/03/16 1000  vancomycin (VANCOCIN) 1,000 mg in sodium chloride 0.9 % 250 mL IVPB  Status:  Discontinued     1,000 mg 250 mL/hr over 60 Minutes Intravenous Every 12 hours 07/03/16 0820 07/03/16 0826   07/03/16 0725  vancomycin (VANCOCIN) 1-5 GM/200ML-% IVPB    Comments:  Romie Minus   : cabinet override      07/03/16 0725 07/03/16 1929   07/03/16 0725  vancomycin (VANCOCIN) 1-5 GM/200ML-% IVPB    Comments:  Romie Minus   : cabinet  override      07/03/16 0725 07/03/16 1929   07/03/16 0530  ceFAZolin (ANCEF) IVPB 2g/100 mL premix  Status:  Discontinued     2 g 200 mL/hr over 30 Minutes Intravenous On call to O.R. 07/03/16 0530 07/03/16 1357       Patient was given sequential compression devices, early ambulation to prevent DVT.  Patient benefited maximally from hospital stay and there were no complications.    Recent vital signs: BP 114/66   Pulse 71   Temp 98 F (36.7 C) (Oral)   Resp 17   SpO2 96%    Discharge Medications:   Allergies as of 07/04/2016      Reactions   Ketamine Other (See Comments)   Unsure if allergic to this, unable to wake up after first back surgery and trouble breathing   Penicillins Swelling   Has patient had a PCN reaction causing immediate rash, facial/tongue/throat swelling, SOB or lightheadedness with hypotension:  UNKNOWN  Has patient had a PCN reaction causing severe rash involving mucus membranes or skin necrosis: unknown Has patient had a PCN reaction that required hospitalization No Has patient had a PCN reaction occurring within the last 10 years: No If all of the above answers are "NO", then may proceed with Cephalosporin use.      Medication List    TAKE these medications   amLODipine-benazepril 5-20 MG capsule Commonly known as:  LOTREL Take 1 capsule by mouth daily. Notes to patient:  You have not received this med during this admission.  [employer] MAGNESIUM 250 MG Tabs Generic drug:  Magnesium Take 250 mg by mouth daily.   multivitamin with minerals Tabs tablet Take 1 tablet by mouth daily. On hold for the procedure   PHILLIPS COLON HEALTH Caps Take 1 capsule by mouth daily. On hold for the procedure   Vitamin D 2000 units Caps Take 2,000 Units by mouth daily. On hold for the procedure       Diagnostic Studies: Dg Chest 2 View  Result Date: 06/26/2016 CLINICAL DATA:  Preoperative examination prior to lumbar fusion surgery. Nonsmoker. No known  cardiopulmonary issues. History of hypertension and gastroesophageal reflux. EXAM: CHEST  2 VIEW COMPARISON:  None in PACs FINDINGS: The lungs are adequately inflated and clear. The heart and pulmonary vascularity are normal. The mediastinum is normal in width. There is no pleural effusion. There is faint calcification in the wall of the aortic arch. The bony thorax exhibits no acute abnormality. IMPRESSION: There is no acute cardiopulmonary abnormality. Thoracic aortic atherosclerosis. Electronically Signed   By: David  [place] M.D.   On: 06/26/2016 10:40   Dg Lumbar Spine 2-3 Views  Result Date: 07/03/2016 CLINICAL DATA:  L4-5 PLIF EXAM: LUMBAR SPINE - 2-3 VIEW COMPARISON:  Earlier today FINDINGS: Changes of anterior lumbar interbody fusion and posterior rod and pedicle screw fixation at L4-5. Hardware in unremarkable position. No evidence of osseous fracture. IMPRESSION: Fluoroscopy for L4-5 fusion.  No unexpected finding. Electronically Signed   By: Marnee Spring M.D.   On: 07/03/2016 13:25   Dg Lumbar Spine 2-3 Views  Result Date: 07/03/2016 CLINICAL DATA:  61 year old male undergoing lumbar surgery. Initial encounter. EXAM: LUMBAR SPINE - 2-3 VIEW; DG C-ARM 61-120 MIN COMPARISON:  Chest radiographs 06/26/2016. FINDINGS: 3 intraoperative fluoroscopic spot views of the lower lumbar spine demonstrate placement of right lateral approach interbody implant and laterally situated vertebral body fusion hardware at what appears to be L4-L5. FLUOROSCOPY TIME:  2 minutes 27 seconds IMPRESSION: Right lateral approach interbody fusion depicted at what appears to be the L4-L5 level. Electronically Signed   By: Odessa Fleming M.D.   On: 07/03/2016 10:54   Dg C-arm 61-120 Min  Result Date: 07/03/2016 CLINICAL DATA:  61 year old male undergoing lumbar surgery. Initial encounter. EXAM: LUMBAR SPINE - 2-3 VIEW; DG C-ARM 61-120 MIN COMPARISON:  Chest radiographs 06/26/2016. FINDINGS: 3 intraoperative fluoroscopic spot views of  the lower lumbar spine demonstrate placement of right lateral approach interbody implant and laterally situated vertebral body fusion hardware at what appears to be L4-L5. FLUOROSCOPY TIME:  2 minutes 27 seconds IMPRESSION: Right lateral approach interbody fusion depicted at what appears to be the L4-L5 level. Electronically Signed   By: Odessa Fleming M.D.   On: 07/03/2016 10:54    Disposition: 01-Home or Self Care  Discharge Instructions    Call MD / Call 911    Complete by:  As directed    If you experience chest pain or shortness of breath, CALL 911 and be transported to the hospital emergency room.  If you develope a fever above 101 F, pus (white drainage) or increased drainage or redness at the wound, or calf pain, call your surgeon's office.   Constipation Prevention    Complete by:  As directed    Drink plenty of fluids.  Prune juice may be helpful.  You may use a stool softener, such as Colace (over the counter) 100 mg twice a day.  Use MiraLax (over the counter) for constipation as needed.   Diet - low  sodium heart healthy    Complete by:  As directed    Increase activity slowly as tolerated    Complete by:  As directed       POD #1 s/p L4/5 lateral/posterior fusion, doing well  - up with PT/OT, encourage ambulation - Percocet for pain, Valium for muscle spasms -Written scripts for pain signed and in chart -D/C instructions sheet printed and in chart -D/C today  -F/U in office 2 weeks   Signed: Georga BoraMCKENZIE, Kourtnie Sachs J 07/17/2016, 12:26 PM

## 2016-09-26 NOTE — Addendum Note (Signed)
Addendum  created 09/26/16 1105 by Hollis, Kevin D, MD   Sign clinical note    

## 2016-12-31 ENCOUNTER — Other Ambulatory Visit: Payer: Self-pay | Admitting: Family Medicine

## 2016-12-31 DIAGNOSIS — N281 Cyst of kidney, acquired: Secondary | ICD-10-CM

## 2017-01-05 ENCOUNTER — Ambulatory Visit
Admission: RE | Admit: 2017-01-05 | Discharge: 2017-01-05 | Disposition: A | Discharge status: Home / Self Care | Payer: BC Managed Care – PPO | Source: Ambulatory Visit | Attending: Family Medicine | Admitting: Family Medicine

## 2017-01-05 DIAGNOSIS — N281 Cyst of kidney, acquired: Secondary | ICD-10-CM

## 2018-04-19 IMAGING — RF DG C-ARM 61-120 MIN
1 series · 3 of 3 positions shown · non-contrast
Comparison: Chest radiographs 06/26/2016.

CLINICAL DATA: 60-year-old male undergoing lumbar surgery. Initial
encounter.

EXAM:
LUMBAR SPINE - 2-3 VIEW; DG C-ARM 61-120 MIN

[Series 1: run · 3 of 3 slices shown]
[im 1/3]
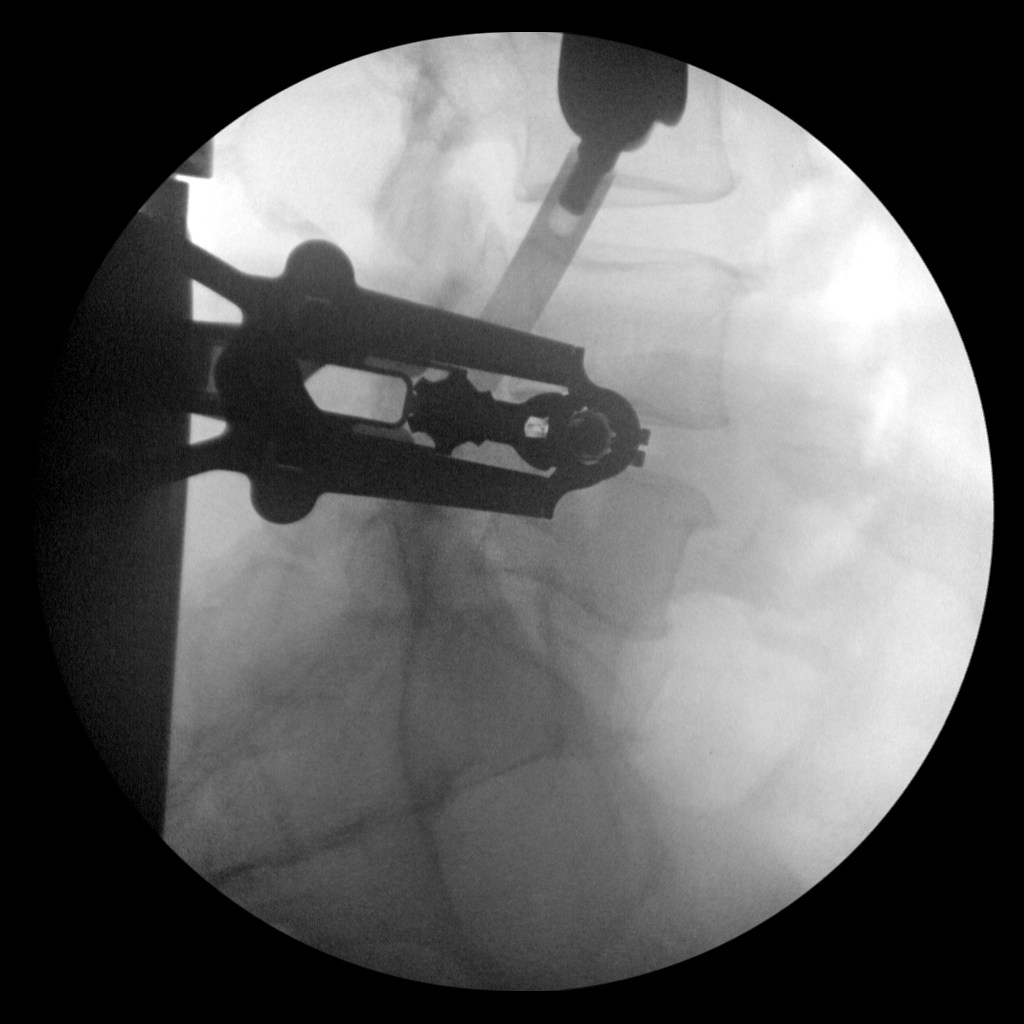
[im 2/3]
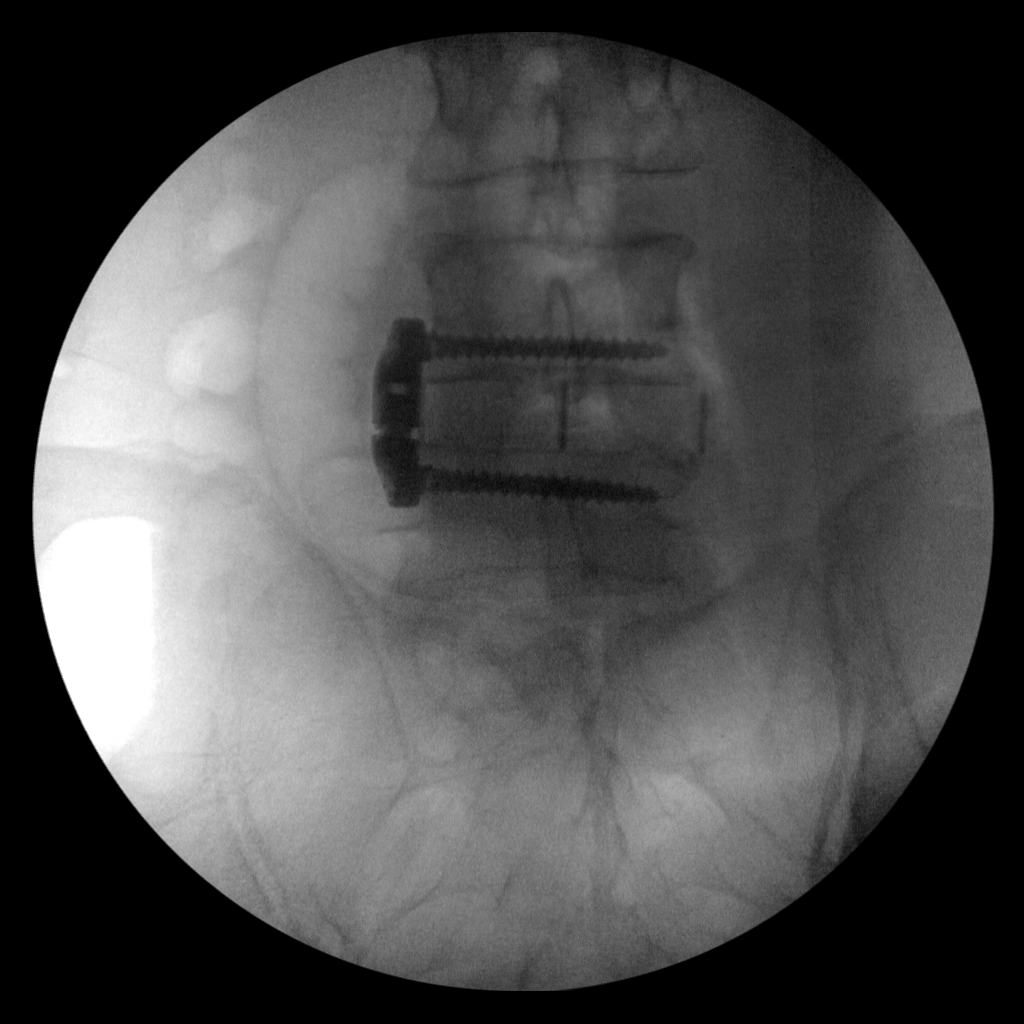
[im 3/3]
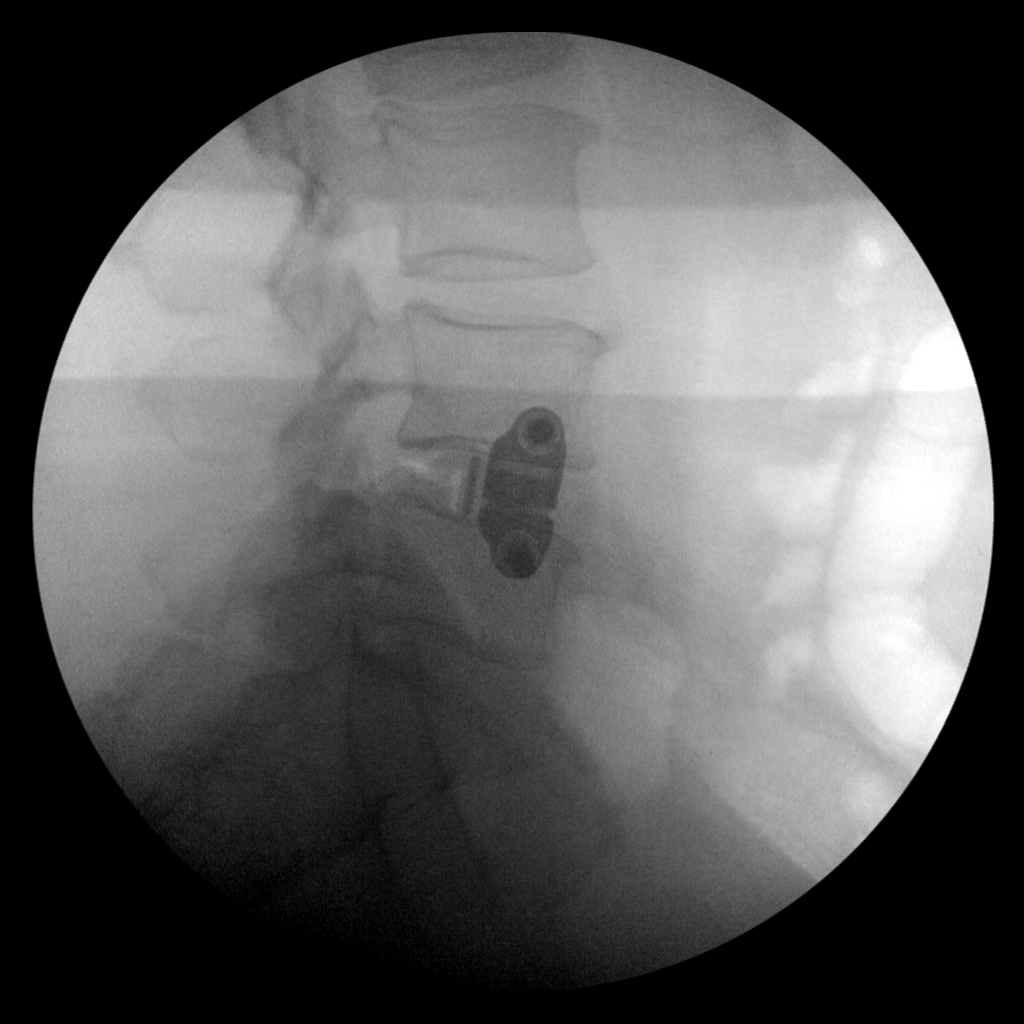

[3 of 3 positions shown; findings below may reference images not displayed]

FINDINGS: 3 intraoperative fluoroscopic spot views of the lower lumbar spine
demonstrate placement of right lateral approach interbody implant
and laterally situated vertebral body fusion hardware at what
appears to be L4-L5.

FLUOROSCOPY TIME:  2 minutes 27 seconds
IMPRESSION: Right lateral approach interbody fusion depicted at what appears to
be the L4-L5 level.

## 2018-05-24 ENCOUNTER — Other Ambulatory Visit: Payer: Self-pay | Admitting: Family Medicine

## 2018-05-24 ENCOUNTER — Ambulatory Visit
Admission: RE | Admit: 2018-05-24 | Discharge: 2018-05-24 | Disposition: A | Discharge status: Home / Self Care | Payer: BC Managed Care – PPO | Source: Ambulatory Visit | Attending: Family Medicine | Admitting: Family Medicine

## 2018-05-24 DIAGNOSIS — R52 Pain, unspecified: Secondary | ICD-10-CM

## 2018-05-26 ENCOUNTER — Other Ambulatory Visit: Payer: Self-pay | Admitting: Family Medicine

## 2018-05-26 DIAGNOSIS — R42 Dizziness and giddiness: Secondary | ICD-10-CM

## 2018-06-05 ENCOUNTER — Ambulatory Visit
Admission: RE | Admit: 2018-06-05 | Discharge: 2018-06-05 | Disposition: A | Discharge status: Home / Self Care | Payer: BC Managed Care – PPO | Source: Ambulatory Visit | Attending: Family Medicine | Admitting: Family Medicine

## 2018-06-05 DIAGNOSIS — R42 Dizziness and giddiness: Secondary | ICD-10-CM

## 2018-06-05 MED ORDER — GADOBENATE DIMEGLUMINE 529 MG/ML IV SOLN
20.0000 mL | Freq: Once | INTRAVENOUS | Status: AC | PRN
  Administered 2018-06-05: 20 mL via INTRAVENOUS

## 2018-09-15 ENCOUNTER — Encounter: Payer: Self-pay | Admitting: Physical Medicine & Rehabilitation

## 2018-10-07 ENCOUNTER — Other Ambulatory Visit: Payer: Self-pay

## 2018-10-07 ENCOUNTER — Encounter
Payer: No Typology Code available for payment source | Attending: Physical Medicine & Rehabilitation | Admitting: Physical Medicine & Rehabilitation

## 2018-10-07 ENCOUNTER — Encounter: Payer: Self-pay | Admitting: Physical Medicine & Rehabilitation

## 2018-10-07 VITALS — BP 134/88 | HR 80 | Temp 98.3°F | Ht 71.0 in | Wt 240.0 lb

## 2018-10-07 DIAGNOSIS — M961 Postlaminectomy syndrome, not elsewhere classified: Secondary | ICD-10-CM | POA: Diagnosis not present

## 2018-10-07 DIAGNOSIS — M5416 Radiculopathy, lumbar region: Secondary | ICD-10-CM | POA: Insufficient documentation

## 2018-10-07 MED ORDER — DULOXETINE HCL 20 MG PO CPEP: 20.0000 mg | ORAL_CAPSULE | Freq: Every day | ORAL | 1 refills | Status: DC

## 2018-10-07 NOTE — Patient Instructions (Signed)

## 2018-10-07 NOTE — Progress Notes (Signed)
Subjective:    Patient ID: George Aguirre, male    DOB: Mar 15, 1956, 63 y.o.   MRN: [id number]  HPI DOI 02/28/13 Patient was injured while working at [employer]. Started with Left leg and left calf, had L4-5 microdiscectomy 2015 .  Post op pain "migrated to RIght side" buttocks to thigh and calf The patient had a repeat L4-5 microdiscectomy left and right side on 08/17/2015.  This was with Dr.Cailiff His last surgery was L4-5 ex lift with instrumentation 07/03/2016 with Dr. Yevette Edwardsumonski. 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 He underwent EMG/NCV 11/11/2017 he does not recall the findings. His current medications include Robaxin 500 mg as needed he does not take this very often.  He also takes Ambien 5 mg nightly as needed.  In general he will only take this if he has not slept well in 3 or 4 nights.  He states that his pain does wake him up at night at times. No longer has LLE pain  Has had PT after each surgery Felt [facility].  Still does some stretching at home.  He does pelvic lifts.  Tried stationary bike which caused soreness in low back    [place] to first surgery which lasted 3d  3 more injections (xray guided) prior to second injection each lasting ~1 mo  Has numbness in RIght ant thigh, intermittent, has R> L tingling in feet No hx diabetes  Has tried gabapentin for >3348yr, helpful but caused hangover effect Cymbalta was helpful but interfered with sleep even when taking 30 mg in the morning only  Pain increased with prolonged sitting, standing or walking  No problem with bowel or bladder  Per report from [facility] Last MRI demonstrated L3-4 facet arthropathy Solid arthrodesis L4-5 No stenosis at L5-S1 Pain Inventory Average Pain 5 Pain Right Now 4 My pain  is sharp, burning, dull and tingling  In the last 24 hours, has pain interfered with the following? General activity 5 Relation with others 5 Enjoyment of life 6 What TIME of day is your pain at its worst? daytime Sleep (in general) Poor  Pain is worse with: walking and sitting Pain improves with: rest Relief from Meds: 3  Mobility walk without assistance ability to climb steps?  yes do you drive?  yes  Function employed # of hrs/week .40  Neuro/Psych weakness numbness tingling trouble walking  Prior Studies Any changes since last visit?  no CLINICAL DATA:  L4-5 PLIF   EXAM: LUMBAR SPINE - 2-3 VIEW   COMPARISON:  Earlier today   FINDINGS: Changes of anterior lumbar interbody fusion and posterior rod and pedicle screw fixation at L4-5. Hardware in unremarkable position. No evidence of osseous fracture.   IMPRESSION: Fluoroscopy for L4-5 fusion.  No unexpected finding.     Electronically Signed   By: Marnee SpringJonathon  Watts M.D.   On: 07/03/2016 13:25  Physicians involved in your care Any changes since last visit?  no   History reviewed. No pertinent family history. Social History   Socioeconomic History  . Marital status: Married    Spouse name: Not on file  . Number of children: Not on file  . Years of education: Not on file  . Highest education level: Not on file  Occupational History  . Not on file  Social  Needs  . Financial resource strain: Not on file  . Food insecurity    Worry: Not on file    Inability: Not on file  . Transportation needs    Medical: Not on file    Non-medical: Not on file  Tobacco Use  . Smoking status: Never Smoker  . Smokeless tobacco: Never Used  Substance and Sexual Activity  . Alcohol use: No  . Drug use: No  . Sexual activity: Not on file  Lifestyle  . Physical activity    Days per week: Not on file    Minutes per session: Not on file  . Stress: Not on file  Relationships  . Social Herbalist on  phone: Not on file    Gets together: Not on file    Attends religious service: Not on file    Active member of club or organization: Not on file    Attends meetings of clubs or organizations: Not on file    Relationship status: Not on file  Other Topics Concern  . Not on file  Social History Narrative  . Not on file   Past Surgical History:  Procedure Laterality Date  . ANKLE SURGERY     left ankle, growth removal  . ANTERIOR LAT LUMBAR FUSION Right 07/03/2016   Procedure: RIGHT SIDED LUMBAR 4-5 LATERAL INTERBODY FUSION WITH INSTRUMENTATION AND ALLOGRAFT;  Surgeon: Phylliss Bob, MD;  Location: [place];  Service: Orthopedics;  Laterality: Right;  RIGHT SIDED LUMBAR 4-5 LATERAL INTERBODY FUSION WITH INSTRUMENTATION AND ALLOGRAFT  . BACK SURGERY    . TONSILLECTOMY    . WISDOM TOOTH EXTRACTION     Past Medical History:  Diagnosis Date  . Chronic pain   . Complication of anesthesia    had problem with ketamine (hard to wake up and unable to breath, re-intubated after first back surgery)  . GERD (gastroesophageal reflux disease)   . Hypertension    BP 134/88   Pulse 80   Temp 98.3 F (36.8 C)   Ht 5\' 11"  (1.803 m)   Wt 240 lb (108.9 kg)   SpO2 97%   BMI 33.47 kg/m   Opioid Risk Score:   Fall Risk Score:  `1  Depression screen PHQ 2/9  No flowsheet data found. \  Review of Systems  Constitutional: Negative.   HENT: Negative.   Eyes: Negative.   Respiratory: Negative.   Cardiovascular: Negative.   Gastrointestinal: Negative.   Endocrine: Negative.   Genitourinary: Negative.   Musculoskeletal: Positive for arthralgias, back pain, gait problem and myalgias.  Skin: Negative.   Allergic/Immunologic: Negative.   Neurological: Positive for weakness and numbness.  Hematological: Negative.   Psychiatric/Behavioral: Negative.   All other systems reviewed and are negative.      Objective:   Physical Exam Vitals signs and nursing note reviewed.  Constitutional:       Appearance: Normal appearance.  HENT:     Head: Normocephalic and atraumatic.     Nose: Nose normal.     Mouth/Throat:     Mouth: Mucous membranes are moist.  Eyes:     Extraocular Movements: Extraocular movements intact.     Conjunctiva/sclera: Conjunctivae normal.     Pupils: Pupils are equal, round, and reactive to light.  Cardiovascular:     Rate and Rhythm: Normal rate and regular rhythm.  Pulmonary:     Effort: Pulmonary effort is normal. No respiratory distress.     Breath sounds: Normal breath sounds.  Abdominal:  General: Abdomen is flat. Bowel sounds are normal. There is no distension.     Palpations: Abdomen is soft.  Musculoskeletal:     Comments: Lumbar spine with decreased extension, flexion is mildly diminished Positive straight leg raise on the right side with positive bowstring sign Bilateral knees without evidence of effusion erythema or pain with range of motion.  No pain with hip range of motion no pain with ankle or foot range of motion. C-spine has mildly diminished range of motion without pain.   Skin:    General: Skin is warm and dry.  Neurological:     General: No focal deficit present.     Mental Status: He is alert and oriented to person, place, and time.     Coordination: Coordination is intact.     Gait: Gait abnormal.     Deep Tendon Reflexes:     Reflex Scores:      Tricep reflexes are 2+ on the right side and 2+ on the left side.      Bicep reflexes are 2+ on the right side and 2+ on the left side.      Brachioradialis reflexes are 2+ on the right side and 2+ on the left side.      Patellar reflexes are 2+ on the right side and 2+ on the left side.      Achilles reflexes are 2+ on the right side and 1+ on the left side.    Comments: Motor strength is 5/5 bilateral deltoid bicep tricep grip hip flexor knee extensor ankle dorsiflexor and plantar flexor  Gait is antalgic patient has a shortened stance phase on the right lower extremity   Psychiatric:        Mood and Affect: Mood normal.        Behavior: Behavior normal.           Assessment & Plan:  1.  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  He has a radicular component which likely corresponds the L4 nerve root.  Would like to see the EMG/NCV performed in 2019. We discussed that given the chronicity of his symptoms it is unlikely that they will spontaneously resolve.  We discussed that we can help manage the pain with medications that should not cause significant side effects. Given his previous success with duloxetine will start out with a small dose i.e. 20 mg in the morning first thing. Have gone over some exercises with him including knee extension exercises.  He has some mild quad weakness which may be either from a chronic L4 radiculopathy or perhaps mainly from disuse.  In either case we will start knee extension exercises which can be done without any weights to start with 3 sets of 10 from a seated position.  He could progress to ankle weights at 5 pounds. He will continue his pelvic lift exercises and add the cat cow exercise we reviewed this with the patient.  He has printed instructions.  2.  Low back pain this is more recent Given his symptomatology I suspect either facet arthropathy above or below the fusion plus minus sacroiliac pain on the right side. This is not severe at this point will follow-up at next visit Physical medicine rehab follow-up in 1 month Discussed his findings with his case manager Lynann BolognaKendra Pennington [phone] this was using the patient's cell phone on speaker mode.

## 2018-10-29 ENCOUNTER — Other Ambulatory Visit: Payer: Self-pay | Admitting: Physical Medicine & Rehabilitation

## 2018-11-05 ENCOUNTER — Other Ambulatory Visit: Payer: Self-pay

## 2018-11-05 ENCOUNTER — Encounter
Payer: No Typology Code available for payment source | Attending: Physical Medicine & Rehabilitation | Admitting: Physical Medicine & Rehabilitation

## 2018-11-05 ENCOUNTER — Encounter: Payer: Self-pay | Admitting: Physical Medicine & Rehabilitation

## 2018-11-05 VITALS — BP 128/83 | HR 81 | Temp 97.5°F | Ht 71.0 in | Wt 233.0 lb

## 2018-11-05 DIAGNOSIS — M961 Postlaminectomy syndrome, not elsewhere classified: Secondary | ICD-10-CM | POA: Insufficient documentation

## 2018-11-05 DIAGNOSIS — M5416 Radiculopathy, lumbar region: Secondary | ICD-10-CM | POA: Insufficient documentation

## 2018-11-05 MED ORDER — ZOLPIDEM TARTRATE 5 MG PO TABS: 5.0000 mg | ORAL_TABLET | Freq: Every evening | ORAL | 0 refills | Status: DC | PRN

## 2018-11-05 MED ORDER — LIDOCAINE 5 % EX PTCH: 1.0000 | MEDICATED_PATCH | CUTANEOUS | 1 refills | Status: DC

## 2018-11-05 NOTE — Progress Notes (Signed)
Subjective:    Patient ID: George Aguirre, male    DOB: 1955-06-03, 63 y.o.   MRN: [id number]  HPI On Cymbalta 20mg   Not too bad for sleep Has ED , diarrhea and sweating  Gets hangover effect from 100mg  gabapentin  Tried Lyrica 75mg  and T#3 per Dr Lorre Munroe caused excess drowsiness  Poor sleep due to pain in ant thigh Also took acetaminphen last noc    Pain Inventory Average Pain 4 Pain Right Now 6 My pain is sharp, burning, dull and tingling  In the last 24 hours, has pain interfered with the following? General activity 5 Relation with others 5 Enjoyment of life 7 What TIME of day is your pain at its worst? night Sleep (in general) Poor  Pain is worse with: walking, sitting and standing Pain improves with: rest, therapy/exercise and medication Relief from Meds: 3  Mobility walk without assistance ability to climb steps?  yes do you drive?  yes  Function employed # of hrs/week 40  Neuro/Psych weakness numbness tingling trouble walking  Prior Studies Any changes since last visit?  no  Physicians involved in your care Any changes since last visit?  no   No family history on file. Social History   Socioeconomic History  . Marital status: Married    Spouse name: Not on file  . Number of children: Not on file  . Years of education: Not on file  . Highest education level: Not on file  Occupational History  . Not on file  Social Needs  . Financial resource strain: Not on file  . Food insecurity    Worry: Not on file    Inability: Not on file  . Transportation needs    Medical: Not on file    Non-medical: Not on file  Tobacco Use  . Smoking status: Never Smoker  . Smokeless tobacco: Never Used  Substance and Sexual Activity  . Alcohol use: No  . Drug use: No  . Sexual activity: Not on file  Lifestyle  . Physical activity    Days per week: Not on file    Minutes per session: Not on file  . Stress: Not on file  Relationships  . Social  Herbalist on phone: Not on file    Gets together: Not on file    Attends religious service: Not on file    Active member of club or organization: Not on file    Attends meetings of clubs or organizations: Not on file    Relationship status: Not on file  Other Topics Concern  . Not on file  Social History Narrative  . Not on file   Past Surgical History:  Procedure Laterality Date  . ANKLE SURGERY     left ankle, growth removal  . ANTERIOR LAT LUMBAR FUSION Right 07/03/2016   Procedure: RIGHT SIDED LUMBAR 4-5 LATERAL INTERBODY FUSION WITH INSTRUMENTATION AND ALLOGRAFT;  Surgeon: Phylliss Bob, MD;  Location: [place];  Service: Orthopedics;  Laterality: Right;  RIGHT SIDED LUMBAR 4-5 LATERAL INTERBODY FUSION WITH INSTRUMENTATION AND ALLOGRAFT  . BACK SURGERY    . TONSILLECTOMY    . WISDOM TOOTH EXTRACTION     Past Medical History:  Diagnosis Date  . Chronic pain   . Complication of anesthesia    had problem with ketamine (hard to wake up and unable to breath, re-intubated after first back surgery)  . GERD (gastroesophageal reflux disease)   . Hypertension    BP 128/83  Pulse 81   Temp (!) 97.5 F (36.4 C)   Ht 5\' 11"  (1.803 m)   Wt 233 lb (105.7 kg)   SpO2 97%   BMI 32.50 kg/m   Opioid Risk Score:   Fall Risk Score:  `1  Depression screen PHQ 2/9  No flowsheet data found.   Review of Systems  Constitutional: Negative.   HENT: Negative.   Eyes: Negative.   Respiratory: Negative.   Cardiovascular: Negative.   Gastrointestinal: Positive for diarrhea.  Endocrine: Negative.   Genitourinary: Negative.   Musculoskeletal: Positive for arthralgias, back pain, gait problem and myalgias.  Skin: Negative.   Allergic/Immunologic: Negative.   Neurological: Positive for weakness and numbness.  Hematological: Negative.   Psychiatric/Behavioral: Negative.   All other systems reviewed and are negative.      Objective:   Physical Exam Vitals signs and nursing  note reviewed.  Constitutional:      Appearance: Normal appearance.  HENT:     Head: Normocephalic and atraumatic.     Mouth/Throat:     Mouth: Mucous membranes are dry.  Neurological:     Mental Status: He is alert and oriented to person, place, and time.     Sensory: Sensory deficit present.     Gait: Gait abnormal.     Comments: Diminished LT sensation R lateral calf and thigh and R ant thigh  Motor strength is 5/5 bilateral hip flexor knee extensor ankle dorsiflexor.  Patient has antalgic gait, reduced duration of stance phase on the right lower extremity  Psychiatric:        Mood and Affect: Mood normal.        Behavior: Behavior normal.           Assessment & Plan:  1.  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  He is sensitive to numerous medications even at low doses.  We discussed other treatment options including topical therapy.  We will start with Lidoderm patch to right lateral calf.  We may need to increase it to 2 patches nightly or even 3. Would continue with core strengthening exercises increasing repetitions  We discussed the possibility of needing to add Lyrica, or in the event that his Lidoderm is not helping, to switch to Lyrica.  We will wean Cymbalta reduce to every other day x1 week then stop entirely  Return to clinic 1 month Spoke with Lynann BolognaKendra Pennington RN case manager with pt in room about recommendation

## 2018-11-05 NOTE — Patient Instructions (Signed)
Please add additional repetitions to your exercises

## 2018-12-16 ENCOUNTER — Encounter: Payer: Self-pay | Admitting: Physical Medicine & Rehabilitation

## 2018-12-16 ENCOUNTER — Other Ambulatory Visit: Payer: Self-pay

## 2018-12-16 ENCOUNTER — Encounter
Payer: No Typology Code available for payment source | Attending: Physical Medicine & Rehabilitation | Admitting: Physical Medicine & Rehabilitation

## 2018-12-16 VITALS — BP 126/82 | HR 72 | Temp 98.0°F | Ht 71.0 in | Wt 230.0 lb

## 2018-12-16 DIAGNOSIS — M5416 Radiculopathy, lumbar region: Secondary | ICD-10-CM

## 2018-12-16 DIAGNOSIS — M961 Postlaminectomy syndrome, not elsewhere classified: Secondary | ICD-10-CM | POA: Diagnosis present

## 2018-12-16 MED ORDER — GABAPENTIN 100 MG PO CAPS: 100.0000 mg | ORAL_CAPSULE | Freq: Every day | ORAL | 1 refills | Status: DC

## 2018-12-16 MED ORDER — ZOLPIDEM TARTRATE 5 MG PO TABS: 5.0000 mg | ORAL_TABLET | Freq: Every evening | ORAL | 0 refills | Status: DC | PRN

## 2018-12-16 NOTE — Patient Instructions (Signed)
May take gabapentin in the evening after work

## 2018-12-16 NOTE — Progress Notes (Signed)
Subjective:    Patient ID: George Aguirre, male    DOB: [birth year], 63 y.o.   MRN: [id number]  HPI  63 year old male with lumbar postlaminectomy syndrome.  The patient has a chronic right L4 radiculopathy.  This has resulted with right lateral thigh and right lateral leg neuropathic pain. Both cymbalta and gabapentin helped with nerve pain , the cymbalta caused diarrhea and the gabapentin casued some drowsiness which was partially mitigated by taking at 6pm  Works full-time at [employer] Average Pain 5 Pain Right Now 4 My pain is constant, sharp, burning, stabbing and aching  In the last 24 hours, has pain interfered with the following? General activity 6 Relation with others 6 Enjoyment of life 7 What TIME of day is your pain at its worst? daytime and night Sleep (in general) Poor  Pain is worse with: walking, sitting and standing Pain improves with: rest, therapy/exercise and medication Relief from Meds: 2  Mobility walk with assistance how many minutes can you walk? 5 ability to climb steps?  yes do you drive?  yes  Function employed # of hrs/week 40  Neuro/Psych weakness numbness tingling trouble walking spasms  Prior Studies Any changes since last visit?  no  Physicians involved in your care Any changes since last visit?  no   No family history on file. Social History   Socioeconomic History  . Marital status: Married    Spouse name: Not on file  . Number of children: Not on file  . Years of education: Not on file  . Highest education level: Not on file  Occupational History  . Not on file  Social Needs  . Financial resource strain: Not on file  . Food insecurity    Worry: Not on file    Inability: Not on file  . Transportation needs    Medical: Not on file    Non-medical: Not on file  Tobacco Use  . Smoking status: Never Smoker  . Smokeless tobacco: Never Used  Substance and Sexual Activity  . Alcohol use: No  . Drug use: No  .  Sexual activity: Not on file  Lifestyle  . Physical activity    Days per week: Not on file    Minutes per session: Not on file  . Stress: Not on file  Relationships  . Social Herbalist on phone: Not on file    Gets together: Not on file    Attends religious service: Not on file    Active member of club or organization: Not on file    Attends meetings of clubs or organizations: Not on file    Relationship status: Not on file  Other Topics Concern  . Not on file  Social History Narrative  . Not on file   Past Surgical History:  Procedure Laterality Date  . ANKLE SURGERY     left ankle, growth removal  . ANTERIOR LAT LUMBAR FUSION Right 07/03/2016   Procedure: RIGHT SIDED LUMBAR 4-5 LATERAL INTERBODY FUSION WITH INSTRUMENTATION AND ALLOGRAFT;  Surgeon: Phylliss Bob, MD;  Location: [facility];  Service: Orthopedics;  Laterality: Right;  RIGHT SIDED LUMBAR 4-5 LATERAL INTERBODY FUSION WITH INSTRUMENTATION AND ALLOGRAFT  . BACK SURGERY    . TONSILLECTOMY    . WISDOM TOOTH EXTRACTION     Past Medical History:  Diagnosis Date  . Chronic pain   . Complication of anesthesia    had problem with ketamine (hard to wake up and unable to  breath, re-intubated after first back surgery)  . GERD (gastroesophageal reflux disease)   . Hypertension    BP 126/82   Pulse 72   Temp 98 F (36.7 C)   Ht 5\' 11"  (1.803 m)   Wt 230 lb (104.3 kg)   SpO2 94%   BMI 32.08 kg/m   Opioid Risk Score:   Fall Risk Score:  `1  Depression screen PHQ 2/9  No flowsheet data found.  Review of Systems  Constitutional: Negative.   HENT: Negative.   Eyes: Negative.   Respiratory: Negative.   Cardiovascular: Negative.   Gastrointestinal: Negative.   Endocrine: Negative.   Genitourinary: Negative.   Musculoskeletal: Positive for gait problem.  Allergic/Immunologic: Negative.   Neurological: Positive for weakness and numbness.  Hematological: Negative.   Psychiatric/Behavioral: Negative.   All  other systems reviewed and are negative.      Objective:   Physical Exam Constitutional:      Appearance: Normal appearance.  HENT:     Head: Normocephalic and atraumatic.  Eyes:     Extraocular Movements: Extraocular movements intact.     Conjunctiva/sclera: Conjunctivae normal.     Pupils: Pupils are equal, round, and reactive to light.  Neurological:     General: No focal deficit present.     Mental Status: He is alert and oriented to person, place, and time.     Cranial Nerves: Cranial nerves are intact.     Sensory: Sensory deficit present.     Motor: Motor function is intact.     Coordination: Coordination is intact.     Gait: Gait is intact.     Comments: Motor strength is 5/5 bilateral hip flexor knee extensor ankle dorsiflexor Sensation is reduced to light touch and pinprick right lateral thigh as well as right lateral leg.  Psychiatric:        Mood and Affect: Mood normal.        Behavior: Behavior normal.    Ambulates without assistive device no evidence of drag or knee instability       Assessment & Plan:  #1.  Lumbar postlaminectomy syndrome with chronic neuropathic pain right L4 radiculopathy.  We discussed his neuropathic pain medication options.  While he has not been trialed on Lyrica and it may be helpful for him I would be concerned that it is longer acting then gabapentin and it has a higher incidence of causing drowsiness.  He states that he did relatively well with the gabapentin 100 mg in the evening if he took it around 6 PM.  He is willing to give this another try.  I think this is reasonable. We also discussed the importance of continuing his home exercise program the importance of stretching the reason why he needs to do this on a chronic basis. His permanent restrictions are as per Dr. Yevette Edwardsumonski. I will see the patient back in approximately 6 weeks  We discussed my impression as well as the treatment plan with the patient's nurse case manager.

## 2019-01-27 ENCOUNTER — Other Ambulatory Visit: Payer: Self-pay

## 2019-01-27 ENCOUNTER — Encounter
Payer: No Typology Code available for payment source | Attending: Physical Medicine & Rehabilitation | Admitting: Physical Medicine & Rehabilitation

## 2019-01-27 ENCOUNTER — Encounter: Payer: Self-pay | Admitting: Physical Medicine & Rehabilitation

## 2019-01-27 VITALS — BP 130/84 | HR 69 | Temp 97.9°F | Ht 71.0 in | Wt 232.0 lb

## 2019-01-27 DIAGNOSIS — M5416 Radiculopathy, lumbar region: Secondary | ICD-10-CM | POA: Diagnosis not present

## 2019-01-27 DIAGNOSIS — M961 Postlaminectomy syndrome, not elsewhere classified: Secondary | ICD-10-CM | POA: Diagnosis not present

## 2019-01-27 MED ORDER — GABAPENTIN 100 MG PO CAPS: 100.0000 mg | ORAL_CAPSULE | Freq: Three times a day (TID) | ORAL | 1 refills | Status: DC

## 2019-01-27 NOTE — Progress Notes (Signed)
Subjective:    Patient ID: George Aguirre, male    DOB: 07-10-1955, 63 y.o.   MRN: [id number]  HPI  63 year old male with a work-related low back pain who is undergone L4-L5 PLIF in 2018.  He has a history of chronic right L4 radicular pain Pt c/o RIght thigh weakness when coming down steps  "nerve pain"  Improved after stopping hamstring exercises  Right leg getting more painful with forward flexion. Sizes lumbar twisting with knees flexed side to side while on the ground.  Hamstring stretching exercises.  Pelvic lift exercises.  He has started biking on a stationary bicycle 1 minute time has worked up to 2 minutes/day Pain Inventory Average Pain 5 Pain Right Now 4 My pain is constant, sharp, burning, stabbing and tingling  In the last 24 hours, has pain interfered with the following? General activity 6 Relation with others 6 Enjoyment of life 6 What TIME of day is your pain at its worst? daytime Sleep (in general) Poor  Pain is worse with: walking, sitting and standing Pain improves with: rest, therapy/exercise and medication Relief from Meds: 2  Mobility walk without assistance ability to climb steps?  yes do you drive?  yes  Function employed # of hrs/week 40  Neuro/Psych weakness numbness tingling trouble walking spasms  Prior Studies Any changes since last visit?  no  Physicians involved in your care Any changes since last visit?  no   No family history on file. Social History   Socioeconomic History  . Marital status: Married    Spouse name: Not on file  . Number of children: Not on file  . Years of education: Not on file  . Highest education level: Not on file  Occupational History  . Not on file  Social Needs  . Financial resource strain: Not on file  . Food insecurity    Worry: Not on file    Inability: Not on file  . Transportation needs    Medical: Not on file    Non-medical: Not on file  Tobacco Use  . Smoking status: Never Smoker   . Smokeless tobacco: Never Used  Substance and Sexual Activity  . Alcohol use: No  . Drug use: No  . Sexual activity: Not on file  Lifestyle  . Physical activity    Days per week: Not on file    Minutes per session: Not on file  . Stress: Not on file  Relationships  . Social Musician on phone: Not on file    Gets together: Not on file    Attends religious service: Not on file    Active member of club or organization: Not on file    Attends meetings of clubs or organizations: Not on file    Relationship status: Not on file  Other Topics Concern  . Not on file  Social History Narrative  . Not on file   Past Surgical History:  Procedure Laterality Date  . ANKLE SURGERY     left ankle, growth removal  . ANTERIOR LAT LUMBAR FUSION Right 07/03/2016   Procedure: RIGHT SIDED LUMBAR 4-5 LATERAL INTERBODY FUSION WITH INSTRUMENTATION AND ALLOGRAFT;  Surgeon: Estill Bamberg, MD;  Location: [facility];  Service: Orthopedics;  Laterality: Right;  RIGHT SIDED LUMBAR 4-5 LATERAL INTERBODY FUSION WITH INSTRUMENTATION AND ALLOGRAFT  . BACK SURGERY    . TONSILLECTOMY    . WISDOM TOOTH EXTRACTION     Past Medical History:  Diagnosis Date  .  Chronic pain   . Complication of anesthesia    had problem with ketamine (hard to wake up and unable to breath, re-intubated after first back surgery)  . GERD (gastroesophageal reflux disease)   . Hypertension    BP 130/84   Pulse 69   Temp 97.9 F (36.6 C)   Ht 5\' 11"  (1.803 m)   Wt 232 lb (105.2 kg)   SpO2 97%   BMI 32.36 kg/m   Opioid Risk Score:   Fall Risk Score:  `1  Depression screen PHQ 2/9  No flowsheet data found.   Review of Systems  Constitutional: Negative.   HENT: Negative.   Eyes: Negative.   Respiratory: Negative.   Cardiovascular: Negative.   Gastrointestinal: Negative.   Endocrine: Negative.   Genitourinary: Negative.   Musculoskeletal: Positive for arthralgias, gait problem and myalgias.  Skin: Negative.    Allergic/Immunologic: Negative.   Neurological: Positive for weakness and numbness.  Hematological: Negative.   Psychiatric/Behavioral: Negative.   All other systems reviewed and are negative.      Objective:   Physical Exam Vitals signs reviewed.  Constitutional:      Appearance: He is obese.  Eyes:     Extraocular Movements: Extraocular movements intact.     Conjunctiva/sclera: Conjunctivae normal.     Pupils: Pupils are equal, round, and reactive to light.  Musculoskeletal:     Lumbar back: He exhibits decreased range of motion.  Neurological:     General: No focal deficit present.     Mental Status: He is alert and oriented to person, place, and time.    Pain with lumbar flexion greater than with extension.  5/5 strength bilateral hip flexor knee extensor ankle dorsiflexor Sensation has paresthesias right thigh as well as right lateral leg No swelling in the right thigh no knee effusion.    Assessment & Plan:    Impression  Lumbar postlaminectomy syndrome with chronic radicular pain right L4 distribution. We discussed that he may have some scarring around his nerve root and that this may be causing some nerve traction when bending forward or stretching his hamstring.  We discussed that he should back off of his strict hamstring stretching exercises for 4 weeks and will reassess. He may continue his other exercises as well as gradually advancing his time on stationary bicycle. Will increase gabapentin to 100 mg in the morning and 200 mg nightly Discussed with his nurse case manager  Over half of the 25 min visit was spent counseling and coordinating care.

## 2019-01-27 NOTE — Patient Instructions (Signed)
Back off hamstring ex  Cont cat/cow and pelvic lift exercise  Increase time on bike by no more than 10% per week

## 2019-02-24 ENCOUNTER — Other Ambulatory Visit: Payer: Self-pay

## 2019-02-24 ENCOUNTER — Encounter: Payer: Self-pay | Admitting: Physical Medicine & Rehabilitation

## 2019-02-24 ENCOUNTER — Encounter
Payer: No Typology Code available for payment source | Attending: Physical Medicine & Rehabilitation | Admitting: Physical Medicine & Rehabilitation

## 2019-02-24 VITALS — BP 124/83 | HR 80 | Temp 97.7°F | Ht 71.0 in | Wt 232.0 lb

## 2019-02-24 DIAGNOSIS — M5416 Radiculopathy, lumbar region: Secondary | ICD-10-CM | POA: Diagnosis not present

## 2019-02-24 DIAGNOSIS — M961 Postlaminectomy syndrome, not elsewhere classified: Secondary | ICD-10-CM

## 2019-02-24 MED ORDER — PREGABALIN 25 MG PO CAPS: 25.0000 mg | ORAL_CAPSULE | Freq: Every day | ORAL | 1 refills | Status: DC

## 2019-02-24 NOTE — Progress Notes (Signed)
Subjective:    Patient ID: George Aguirre, male    DOB: [birth year], 63 y.o.   MRN: [id number]  HPI  62 year old male with history of lumbar postlaminectomy syndrome with chronic postoperative pain.  He has evidence of chronic right L4 nerve root distribution pain and numbness.  He has had 3 surgeries per his report and the last one was in 2018.  The patient has tried duloxetine for pain as well as gabapentin.  The duloxetine have a side effect of diarrhea and sweating.  He is able to tolerate 100 mg of gabapentin at night but he cannot tolerate it during the day due to drowsiness.  In the past he was trialed on 75 mg of Lyrica and together with Tylenol 3 he had excessive drowsiness.  The patient has gone through physical therapy and continues with hip lift exercises as well as cat cow exercise as well as lumbar twisting.  He does have pain aggravation with use of stationary bicycle.  Ambulation also aggravates pain. Despite this patient does work full-time at [employer] The patient does have some low back pain but this is not as significant as his leg pain. Right thigh pain not much better  Some blurring of vision when gabapentin was increased, this has since resolved as he resumed his usual 100 mg nightly dose Considering Lyrica  Discussed TENS, Acupuncture and  Pain Inventory Average Pain 5 Pain Right Now 4 My pain is constant, sharp, burning, stabbing and aching  In the last 24 hours, has pain interfered with the following? General activity 5 Relation with others 5 Enjoyment of life 5 What TIME of day is your pain at its worst? daytime Sleep (in general) Poor  Pain is worse with: walking, sitting and standing Pain improves with: rest, therapy/exercise and medication Relief from Meds: 3  Mobility walk without assistance ability to climb steps?  yes do you drive?  yes  Function employed # of hrs/week 40  Neuro/Psych weakness numbness tingling trouble walking spasms  Prior  Studies Any changes since last visit?  no  Physicians involved in your care Any changes since last visit?  no   No family history on file. Social History   Socioeconomic History  . Marital status: Married    Spouse name: Not on file  . Number of children: Not on file  . Years of education: Not on file  . Highest education level: Not on file  Occupational History  . Not on file  Social Needs  . Financial resource strain: Not on file  . Food insecurity    Worry: Not on file    Inability: Not on file  . Transportation needs    Medical: Not on file    Non-medical: Not on file  Tobacco Use  . Smoking status: Never Smoker  . Smokeless tobacco: Never Used  Substance and Sexual Activity  . Alcohol use: No  . Drug use: No  . Sexual activity: Not on file  Lifestyle  . Physical activity    Days per week: Not on file    Minutes per session: Not on file  . Stress: Not on file  Relationships  . Social Musician on phone: Not on file    Gets together: Not on file    Attends religious service: Not on file    Active member of club or organization: Not on file    Attends meetings of clubs or organizations: Not on file    Relationship  status: Not on file  Other Topics Concern  . Not on file  Social History Narrative  . Not on file   Past Surgical History:  Procedure Laterality Date  . ANKLE SURGERY     left ankle, growth removal  . ANTERIOR LAT LUMBAR FUSION Right 07/03/2016   Procedure: RIGHT SIDED LUMBAR 4-5 LATERAL INTERBODY FUSION WITH INSTRUMENTATION AND ALLOGRAFT;  Surgeon: Phylliss Bob, MD;  Location: [facility];  Service: Orthopedics;  Laterality: Right;  RIGHT SIDED LUMBAR 4-5 LATERAL INTERBODY FUSION WITH INSTRUMENTATION AND ALLOGRAFT  . BACK SURGERY    . TONSILLECTOMY    . WISDOM TOOTH EXTRACTION     Past Medical History:  Diagnosis Date  . Chronic pain   . Complication of anesthesia    had problem with ketamine (hard to wake up and unable to breath,  re-intubated after first back surgery)  . GERD (gastroesophageal reflux disease)   . Hypertension    There were no vitals taken for this visit.  Opioid Risk Score:   Fall Risk Score:  `1  Depression screen PHQ 2/9  No flowsheet data found.   Review of Systems  Constitutional: Negative.   HENT: Negative.   Eyes: Negative.   Respiratory: Negative.   Cardiovascular: Negative.   Gastrointestinal: Negative.   Endocrine: Negative.   Genitourinary: Negative.   Musculoskeletal: Positive for arthralgias, gait problem and myalgias.  Skin: Negative.   Allergic/Immunologic: Negative.   Neurological: Positive for weakness and numbness.  Hematological: Negative.   Psychiatric/Behavioral: Negative.   All other systems reviewed and are negative.      Objective:   Physical Exam Vitals signs and nursing note reviewed.  Constitutional:      Appearance: Normal appearance.  HENT:     Head: Normocephalic and atraumatic.  Eyes:     Extraocular Movements: Extraocular movements intact.     Conjunctiva/sclera: Conjunctivae normal.     Pupils: Pupils are equal, round, and reactive to light.  Musculoskeletal:     Lumbar back: He exhibits tenderness.     Comments: Tenderness to palpation bilateral lumbar paraspinals.  Negative straight leg raising  Skin:    General: Skin is warm and dry.  Neurological:     General: No focal deficit present.     Mental Status: He is alert and oriented to person, place, and time.     Comments: Motor strength is 5/5 bilateral hip flexor knee extensor ankle dorsiflexor Sensation reduced to light touch in the anterior thigh as well as the lateral thigh on the right side only Remainder of sensory exam to light touch in the lower extremities is normal  Psychiatric:        Mood and Affect: Mood normal.        Behavior: Behavior normal.           Assessment & Plan:  #1.  Chronic right L4 radiculopathy.  Overall he is functional but has activity  limitation due to his anterior and lateral thigh pain.  We discussed various treatment options including medication management.  Recommendations were for a trial of Lyrica 25 mg nightly in place of gabapentin.  This will likely need to be increased although the patient is quite sensitive to medications and this will need to be monitored. Also discussed other treatment options, nonpharmaceutical.  We discussed that TENS unit may give a couple hours of relief after the treatment has been discontinued and that if he is to use it during the day at work he would likely need  to keep 4 electrodes on and have a stimulator attached to his belt.  The patient did not feel this would be practical for him. Also discussed spinal cord stimulation and went over the trial and implant process.  The patient does not wish to pursue a more invasive treatment at this time. We discussed acupuncture trial weekly visits x4 and extension of trial to approximately 12 visits if helpful.  Patient would like to pursue this.  We will schedule Discussed recommendations and orders with nurse case manager Lynann BolognaKendra Pennington RN via phone.  [phone]  Over half of the 25 min visit was spent counseling and coordinating care.

## 2019-03-17 ENCOUNTER — Other Ambulatory Visit: Payer: Self-pay

## 2019-03-17 ENCOUNTER — Encounter
Payer: No Typology Code available for payment source | Attending: Physical Medicine & Rehabilitation | Admitting: Physical Medicine & Rehabilitation

## 2019-03-17 ENCOUNTER — Encounter: Payer: Self-pay | Admitting: Physical Medicine & Rehabilitation

## 2019-03-17 VITALS — BP 130/82 | HR 80 | Temp 97.5°F | Wt 230.0 lb

## 2019-03-17 DIAGNOSIS — M961 Postlaminectomy syndrome, not elsewhere classified: Secondary | ICD-10-CM | POA: Insufficient documentation

## 2019-03-17 NOTE — Patient Instructions (Signed)
Acupuncture Acupuncture is a type of treatment that involves stimulating specific points on your body by inserting thin needles through your skin. Acupuncture is often used to treat pain, but it may also be used to help relieve other types of symptoms. Your health care provider may recommend acupuncture to help treat various conditions, such as:  Migraine headaches.  Tension headaches.  Arthritis pain.  Addiction.  Chronic pain.  Nausea and vomiting after a surgery.  High blood pressure (hypertension).  Chronic obstructive pulmonary disease (COPD).  Nausea caused by cancer treatment.  Sudden or severe (acute) pain. Acupuncture is based on traditional [place] medicine, which recognizes more than 2,000 points on the body that connect energy pathways (meridians) through the body. The goal in stimulating these points is to balance the physical, emotional, and mental energy in your body. Acupuncture is done by a health care provider who has specialized training (licensed acupuncture practitioner). Treatment often requires several acupuncture sessions. You may have acupuncture along with other medical treatments. Tell a health care provider about:  Any allergies you have.  All medicines you are taking, including vitamins, herbs, eye drops, creams, and over-the-counter medicines.  Any blood disorders you have.  Any surgeries you have had.  Any medical conditions you have.  Whether you are pregnant or may be pregnant. What are the risks? Generally, this is a safe treatment. However, problems may occur, including:  Skin infection.  Damage to organs or structures that are under the skin where a needle is placed. What happens before the treatment?  Your acupuncture practitioner will ask about your medical history and your symptoms.  You may have a physical exam. What happens during the treatment? The exact procedure will depend on your condition and how your acupuncture provider  treats it. In general:  Your skin will be cleaned with a germ-killing (antiseptic) solution.  Your acupuncture practitioner will open a new set of germ-free (sterile) needles.  The needles will be gently inserted into your skin. They will be left in place for a certain amount of time. You may feel slight pain or a tingling sensation.  Your acupuncture practitioner may: ? Apply electrical energy to the needles. ? Adjust the needles in certain ways.  After your procedure, the acupuncture practitioner will remove the needles, throw them away, and clean your skin. The procedure may vary among health care providers. What can I expect after the treatment? People react differently to acupuncture. Make sure you ask your acupuncture provider what to expect after your treatment. It is common to have:  Minor bruising.  Mild pain.  A small amount of bleeding. Follow these instructions at home:  Follow any instructions given by your provider after the treatment.  Keep all follow-up visits as told by your health care provider. This is important. Contact a health care provider if:  You have questions about your reaction to the treatment.  You have soreness.  You have skin irritation or redness.  You have a fever. Summary  Acupuncture is a type of treatment that involves stimulating specific points on your body by inserting thin needles through your skin.  This treatment is often used to treat pain, but it may also be used to help relieve other types of symptoms.  The exact procedure will depend on your condition and how your acupuncture provider treats it. This information is not intended to replace advice given to you by your health care provider. Make sure you discuss any questions you have with your health  care provider. Document Released: 04/17/2003 Document Revised: 03/27/2017 Document Reviewed: 01/09/2017 Elsevier Patient Education  2020 [employer].

## 2019-03-17 NOTE — Progress Notes (Signed)
Acupuncture treatment #1 Technique: Craig/PENS Indication radicular pain right lower extremity postlaminectomy  It is placed bilaterally at BL 23 and BL 26 , as well as right BL 24, BL 25, Needle placed at GB 30 right side Electrical stimulation 2 Hz for 25 minutes Patient tolerated procedure well  We will schedule next visit within a week

## 2019-03-22 ENCOUNTER — Other Ambulatory Visit: Payer: Self-pay

## 2019-03-22 ENCOUNTER — Encounter: Payer: Self-pay | Admitting: Physical Medicine & Rehabilitation

## 2019-03-22 ENCOUNTER — Encounter
Payer: No Typology Code available for payment source | Attending: Physical Medicine & Rehabilitation | Admitting: Physical Medicine & Rehabilitation

## 2019-03-22 VITALS — BP 139/82 | HR 77 | Temp 98.1°F | Ht 71.0 in | Wt 236.2 lb

## 2019-03-22 DIAGNOSIS — M961 Postlaminectomy syndrome, not elsewhere classified: Secondary | ICD-10-CM | POA: Insufficient documentation

## 2019-03-22 DIAGNOSIS — M5416 Radiculopathy, lumbar region: Secondary | ICD-10-CM | POA: Insufficient documentation

## 2019-03-22 NOTE — Progress Notes (Signed)
Acupuncture treatment #2 Indication is lumbar postlaminectomy syndrome and chronic right lumbar radiculopathy.  Patient had good results with first treatment, 2 days of excellent pain relief and 2 days of good pain relief now back to baseline  Technique: Craig/PENS, acupuncture with electrical stimulation Indication radicular pain right lower extremity postlaminectomy  It is placed bilaterally at BL 23 and BL 26 , as well as right BL 24, BL 25, Needle placed at GB 30 right side  Right GB 31 right GB 34 and right SP 6 Electrical stimulation 2.5 Hz for 25 minutes  Patient tolerated procedure well  We will schedule next visit within a week   

## 2019-03-22 NOTE — Patient Instructions (Signed)
Acupuncture Acupuncture is a type of treatment that involves stimulating specific points on your body by inserting thin needles through your skin. Acupuncture is often used to treat pain, but it may also be used to help relieve other types of symptoms. Your health care provider may recommend acupuncture to help treat various conditions, such as:  Migraine headaches.  Tension headaches.  Arthritis pain.  Addiction.  Chronic pain.  Nausea and vomiting after a surgery.  High blood pressure (hypertension).  Chronic obstructive pulmonary disease (COPD).  Nausea caused by cancer treatment.  Sudden or severe (acute) pain. Acupuncture is based on traditional [place] medicine, which recognizes more than 2,000 points on the body that connect energy pathways (meridians) through the body. The goal in stimulating these points is to balance the physical, emotional, and mental energy in your body. Acupuncture is done by a health care provider who has specialized training (licensed acupuncture practitioner). Treatment often requires several acupuncture sessions. You may have acupuncture along with other medical treatments. Tell a health care provider about:  Any allergies you have.  All medicines you are taking, including vitamins, herbs, eye drops, creams, and over-the-counter medicines.  Any blood disorders you have.  Any surgeries you have had.  Any medical conditions you have.  Whether you are pregnant or may be pregnant. What are the risks? Generally, this is a safe treatment. However, problems may occur, including:  Skin infection.  Damage to organs or structures that are under the skin where a needle is placed. What happens before the treatment?  Your acupuncture practitioner will ask about your medical history and your symptoms.  You may have a physical exam. What happens during the treatment? The exact procedure will depend on your condition and how your acupuncture provider  treats it. In general:  Your skin will be cleaned with a germ-killing (antiseptic) solution.  Your acupuncture practitioner will open a new set of germ-free (sterile) needles.  The needles will be gently inserted into your skin. They will be left in place for a certain amount of time. You may feel slight pain or a tingling sensation.  Your acupuncture practitioner may: ? Apply electrical energy to the needles. ? Adjust the needles in certain ways.  After your procedure, the acupuncture practitioner will remove the needles, throw them away, and clean your skin. The procedure may vary among health care providers. What can I expect after the treatment? People react differently to acupuncture. Make sure you ask your acupuncture provider what to expect after your treatment. It is common to have:  Minor bruising.  Mild pain.  A small amount of bleeding. Follow these instructions at home:  Follow any instructions given by your provider after the treatment.  Keep all follow-up visits as told by your health care provider. This is important. Contact a health care provider if:  You have questions about your reaction to the treatment.  You have soreness.  You have skin irritation or redness.  You have a fever. Summary  Acupuncture is a type of treatment that involves stimulating specific points on your body by inserting thin needles through your skin.  This treatment is often used to treat pain, but it may also be used to help relieve other types of symptoms.  The exact procedure will depend on your condition and how your acupuncture provider treats it. This information is not intended to replace advice given to you by your health care provider. Make sure you discuss any questions you have with your health  care provider. Document Released: 04/17/2003 Document Revised: 03/27/2017 Document Reviewed: 01/09/2017 Elsevier Patient Education  2020 [employer].

## 2019-03-31 ENCOUNTER — Other Ambulatory Visit: Payer: Self-pay

## 2019-03-31 ENCOUNTER — Encounter
Payer: No Typology Code available for payment source | Attending: Physical Medicine & Rehabilitation | Admitting: Physical Medicine & Rehabilitation

## 2019-03-31 ENCOUNTER — Encounter: Payer: Self-pay | Admitting: Physical Medicine & Rehabilitation

## 2019-03-31 VITALS — BP 128/76 | HR 74 | Temp 97.8°F | Ht 71.0 in | Wt 235.0 lb

## 2019-03-31 DIAGNOSIS — M961 Postlaminectomy syndrome, not elsewhere classified: Secondary | ICD-10-CM | POA: Diagnosis present

## 2019-03-31 DIAGNOSIS — M5416 Radiculopathy, lumbar region: Secondary | ICD-10-CM | POA: Diagnosis not present

## 2019-03-31 MED ORDER — GABAPENTIN 100 MG PO CAPS: 100.0000 mg | ORAL_CAPSULE | Freq: Every day | ORAL | 3 refills | Status: DC

## 2019-03-31 NOTE — Patient Instructions (Signed)
Acupuncture Acupuncture is a type of treatment that involves stimulating specific points on your body by inserting thin needles through your skin. Acupuncture is often used to treat pain, but it may also be used to help relieve other types of symptoms. Your health care provider may recommend acupuncture to help treat various conditions, such as:  Migraine headaches.  Tension headaches.  Arthritis pain.  Addiction.  Chronic pain.  Nausea and vomiting after a surgery.  High blood pressure (hypertension).  Chronic obstructive pulmonary disease (COPD).  Nausea caused by cancer treatment.  Sudden or severe (acute) pain. Acupuncture is based on traditional [place] medicine, which recognizes more than 2,000 points on the body that connect energy pathways (meridians) through the body. The goal in stimulating these points is to balance the physical, emotional, and mental energy in your body. Acupuncture is done by a health care provider who has specialized training (licensed acupuncture practitioner). Treatment often requires several acupuncture sessions. You may have acupuncture along with other medical treatments. Tell a health care provider about:  Any allergies you have.  All medicines you are taking, including vitamins, herbs, eye drops, creams, and over-the-counter medicines.  Any blood disorders you have.  Any surgeries you have had.  Any medical conditions you have.  Whether you are pregnant or may be pregnant. What are the risks? Generally, this is a safe treatment. However, problems may occur, including:  Skin infection.  Damage to organs or structures that are under the skin where a needle is placed. What happens before the treatment?  Your acupuncture practitioner will ask about your medical history and your symptoms.  You may have a physical exam. What happens during the treatment? The exact procedure will depend on your condition and how your acupuncture provider  treats it. In general:  Your skin will be cleaned with a germ-killing (antiseptic) solution.  Your acupuncture practitioner will open a new set of germ-free (sterile) needles.  The needles will be gently inserted into your skin. They will be left in place for a certain amount of time. You may feel slight pain or a tingling sensation.  Your acupuncture practitioner may: ? Apply electrical energy to the needles. ? Adjust the needles in certain ways.  After your procedure, the acupuncture practitioner will remove the needles, throw them away, and clean your skin. The procedure may vary among health care providers. What can I expect after the treatment? People react differently to acupuncture. Make sure you ask your acupuncture provider what to expect after your treatment. It is common to have:  Minor bruising.  Mild pain.  A small amount of bleeding. Follow these instructions at home:  Follow any instructions given by your provider after the treatment.  Keep all follow-up visits as told by your health care provider. This is important. Contact a health care provider if:  You have questions about your reaction to the treatment.  You have soreness.  You have skin irritation or redness.  You have a fever. Summary  Acupuncture is a type of treatment that involves stimulating specific points on your body by inserting thin needles through your skin.  This treatment is often used to treat pain, but it may also be used to help relieve other types of symptoms.  The exact procedure will depend on your condition and how your acupuncture provider treats it. This information is not intended to replace advice given to you by your health care provider. Make sure you discuss any questions you have with your health  care provider. Document Released: 04/17/2003 Document Revised: 03/27/2017 Document Reviewed: 01/09/2017 Elsevier Patient Education  2020 [employer].

## 2019-03-31 NOTE — Progress Notes (Signed)
Acupuncture treatment #2 Indication is lumbar postlaminectomy syndrome and chronic right lumbar radiculopathy.  Patient had good results with first treatment, 2 days of excellent pain relief and 2 days of good pain relief now back to baseline  Technique: Craig/PENS, acupuncture with electrical stimulation Indication radicular pain right lower extremity postlaminectomy  It is placed bilaterally at BL 23 and BL 26 , as well as right BL 24, BL 25, Needle placed at GB 30 right side  Right GB 31 right GB 34 and right SP 6 Electrical stimulation 2.5 Hz for 25 minutes  Patient tolerated procedure well  We will schedule next visit within a week

## 2019-04-07 ENCOUNTER — Encounter: Payer: No Typology Code available for payment source | Admitting: Physical Medicine & Rehabilitation

## 2019-04-15 ENCOUNTER — Encounter (HOSPITAL_BASED_OUTPATIENT_CLINIC_OR_DEPARTMENT_OTHER): Payer: No Typology Code available for payment source | Admitting: Physical Medicine & Rehabilitation

## 2019-04-15 ENCOUNTER — Other Ambulatory Visit: Payer: Self-pay

## 2019-04-15 ENCOUNTER — Encounter: Payer: Self-pay | Admitting: Physical Medicine & Rehabilitation

## 2019-04-15 VITALS — BP 141/83 | HR 84 | Temp 98.0°F | Ht 71.0 in | Wt 240.0 lb

## 2019-04-15 DIAGNOSIS — M961 Postlaminectomy syndrome, not elsewhere classified: Secondary | ICD-10-CM | POA: Diagnosis not present

## 2019-04-15 DIAGNOSIS — M5416 Radiculopathy, lumbar region: Secondary | ICD-10-CM

## 2019-04-15 NOTE — Patient Instructions (Signed)
Will request another 4 treatments to be done on a weekly basis.  If the patient can get to 1 week of relief post treatment with at least 50% pain reduction may require another 4 treatments which can be done every 2 weeks for 2 visits and every 3 to 4 weeks for additional 2 visits

## 2019-04-15 NOTE — Progress Notes (Signed)
Acupuncture treatment #4 Indication is lumbar postlaminectomy syndrome and chronic right lumbar radiculopathy.  Patient had good results with first 3 treatment, some recurrence of pain, but missed last appt, last tx 12/3  Technique: Craig/PENS, acupuncture with electrical stimulation Indication radicular pain right lower extremity postlaminectomy  Trunk It is placed bilaterally at BL 23 and BL 26 , as well as right BL 24, BL 25, Needle placed at GB 30 right side  Right lower limb Right GB 31 right GB 34 and right SP 6 Electrical stimulation 2.5 Hz for 25 minutes  Patient tolerated procedure well  Will recommend another 4 treatments with goal of 50% Right lower ext pain reduction for at least 7 days post treatment

## 2019-05-12 ENCOUNTER — Encounter: Payer: Self-pay | Admitting: Physical Medicine & Rehabilitation

## 2019-05-12 ENCOUNTER — Encounter
Payer: No Typology Code available for payment source | Attending: Physical Medicine & Rehabilitation | Admitting: Physical Medicine & Rehabilitation

## 2019-05-12 ENCOUNTER — Other Ambulatory Visit: Payer: Self-pay

## 2019-05-12 VITALS — BP 123/79 | HR 78 | Temp 98.0°F | Ht 71.0 in | Wt 235.0 lb

## 2019-05-12 DIAGNOSIS — M961 Postlaminectomy syndrome, not elsewhere classified: Secondary | ICD-10-CM | POA: Insufficient documentation

## 2019-05-12 DIAGNOSIS — M5416 Radiculopathy, lumbar region: Secondary | ICD-10-CM | POA: Insufficient documentation

## 2019-05-12 NOTE — Progress Notes (Signed)
Acupuncture treatment #5 Indication is lumbar postlaminectomy syndrome and chronic right lumbar radiculopathy.  Patient had good results with first 4 treatment, some recurrence of pain, last treatment 04/15/2019 Technique: Craig/PENS, acupuncture with electrical stimulation Indication radicular pain right lower extremity postlaminectomy  Trunk It is placed bilaterally at BL 23 and BL 26 , as well as right BL 24, BL 25, Needle placed at GB 30 right side  Right lower limb Right GB 31 right GB 34 and right SP 6 Electrical stimulation 2.5 Hz for 25 minutes  Patient tolerated procedure well  Will recommend another 4 treatments with goal of 50% Right lower ext pain reduction for at least 7 days post treatment

## 2019-05-12 NOTE — Patient Instructions (Signed)
Acupuncture Acupuncture is a type of treatment that involves stimulating specific points on your body by inserting thin needles through your skin. Acupuncture is often used to treat pain, but it may also be used to help relieve other types of symptoms. Your health care provider may recommend acupuncture to help treat various conditions, such as:  Migraine headaches.  Tension headaches.  Arthritis pain.  Addiction.  Chronic pain.  Nausea and vomiting after a surgery.  High blood pressure (hypertension).  Chronic obstructive pulmonary disease (COPD).  Nausea caused by cancer treatment.  Sudden or severe (acute) pain. Acupuncture is based on traditional Chinese medicine, which recognizes more than 2,000 points on the body that connect energy pathways (meridians) through the body. The goal in stimulating these points is to balance the physical, emotional, and mental energy in your body. Acupuncture is done by a health care provider who has specialized training (licensed acupuncture practitioner). Treatment often requires several acupuncture sessions. You may have acupuncture along with other medical treatments. Tell a health care provider about:  Any allergies you have.  All medicines you are taking, including vitamins, herbs, eye drops, creams, and over-the-counter medicines.  Any blood disorders you have.  Any surgeries you have had.  Any medical conditions you have.  Whether you are pregnant or may be pregnant. What are the risks? Generally, this is a safe treatment. However, problems may occur, including:  Skin infection.  Damage to organs or structures that are under the skin where a needle is placed. What happens before the treatment?  Your acupuncture practitioner will ask about your medical history and your symptoms.  You may have a physical exam. What happens during the treatment? The exact procedure will depend on your condition and how your acupuncture provider  treats it. In general:  Your skin will be cleaned with a germ-killing (antiseptic) solution.  Your acupuncture practitioner will open a new set of germ-free (sterile) needles.  The needles will be gently inserted into your skin. They will be left in place for a certain amount of time. You may feel slight pain or a tingling sensation.  Your acupuncture practitioner may: ? Apply electrical energy to the needles. ? Adjust the needles in certain ways.  After your procedure, the acupuncture practitioner will remove the needles, throw them away, and clean your skin. The procedure may vary among health care providers. What can I expect after the treatment? People react differently to acupuncture. Make sure you ask your acupuncture provider what to expect after your treatment. It is common to have:  Minor bruising.  Mild pain.  A small amount of bleeding. Follow these instructions at home:  Follow any instructions given by your provider after the treatment.  Keep all follow-up visits as told by your health care provider. This is important. Contact a health care provider if:  You have questions about your reaction to the treatment.  You have soreness.  You have skin irritation or redness.  You have a fever. Summary  Acupuncture is a type of treatment that involves stimulating specific points on your body by inserting thin needles through your skin.  This treatment is often used to treat pain, but it may also be used to help relieve other types of symptoms.  The exact procedure will depend on your condition and how your acupuncture provider treats it. This information is not intended to replace advice given to you by your health care provider. Make sure you discuss any questions you have with your health   care provider. Document Revised: 03/27/2017 Document Reviewed: 01/09/2017 Elsevier Patient Education  2020 [employer]  

## 2019-05-19 ENCOUNTER — Encounter (HOSPITAL_BASED_OUTPATIENT_CLINIC_OR_DEPARTMENT_OTHER): Payer: No Typology Code available for payment source | Admitting: Physical Medicine & Rehabilitation

## 2019-05-19 ENCOUNTER — Other Ambulatory Visit: Payer: Self-pay

## 2019-05-19 ENCOUNTER — Encounter: Payer: Self-pay | Admitting: Physical Medicine & Rehabilitation

## 2019-05-19 VITALS — BP 131/82 | HR 79 | Temp 97.5°F | Ht 71.0 in | Wt 234.0 lb

## 2019-05-19 DIAGNOSIS — M961 Postlaminectomy syndrome, not elsewhere classified: Secondary | ICD-10-CM

## 2019-05-19 DIAGNOSIS — M5416 Radiculopathy, lumbar region: Secondary | ICD-10-CM | POA: Diagnosis not present

## 2019-05-19 NOTE — Progress Notes (Signed)
Acupuncture treatment #6  Nerve pain in calf RIght side improved for the last 1wk Indication is lumbar postlaminectomy syndrome and chronic right lumbar radiculopathy.  Patient had good results with first 4 treatment, some recurrence of pain, last treatment 04/15/2019 Technique: Craig/PENS, acupuncture with electrical stimulation Indication radicular pain right lower extremity postlaminectomy  Trunk It is placed bilaterally at BL 23 and BL 26 , as well as right BL 24, BL 25, Needle placed at GB 30 right side  Right lower limb Right GB 31 right GB 34 and right SP 6 Electrical stimulation 2.5 Hz for 20 minutes  Pt then placed in supine and needles placed ST 36, ST 31 and SP6  Patient tolerated procedure well  Will recommend another 2 treatments with goal of 50% Right lower ext pain reduction for at least 10 days post treatment  Cont Gabapentin at night

## 2019-05-26 ENCOUNTER — Encounter: Payer: Self-pay | Admitting: Physical Medicine & Rehabilitation

## 2019-05-26 ENCOUNTER — Encounter (HOSPITAL_BASED_OUTPATIENT_CLINIC_OR_DEPARTMENT_OTHER): Payer: No Typology Code available for payment source | Admitting: Physical Medicine & Rehabilitation

## 2019-05-26 ENCOUNTER — Other Ambulatory Visit: Payer: Self-pay

## 2019-05-26 VITALS — BP 135/77 | HR 86 | Temp 97.7°F | Ht 71.0 in | Wt 237.4 lb

## 2019-05-26 DIAGNOSIS — M961 Postlaminectomy syndrome, not elsewhere classified: Secondary | ICD-10-CM | POA: Diagnosis not present

## 2019-05-26 DIAGNOSIS — M5416 Radiculopathy, lumbar region: Secondary | ICD-10-CM | POA: Diagnosis not present

## 2019-05-26 NOTE — Progress Notes (Signed)
Acupuncture treatment #7  Nerve pain in calf RIght side improved for the last 1wk Indication is lumbar postlaminectomy syndrome and chronic right lumbar radiculopathy.  Last treatment 05/19/2019, the patient still notes that his pain level is better than on 05/18/2019.  The maximum effect of the treatment wore off at day 4 or day 5 Technique: Craig/PENS, acupuncture with electrical stimulation Indication radicular pain right lower extremity postlaminectomy    Trunk It is placed bilaterally at BL 23 and BL 26 , as well as right BL 24, BL 25, Needle placed at GB 30 right side  Right lower limb Right GB 31 right GB 34 and right SP 6 Electrical stimulation 2.5 Hz for 20 minutes  Pt then placed in supine and needles placed ST 36, ST 31 and SP6  Patient tolerated procedure well  Recommend another 5 treatments assess effect.  Next treatment will be 8 days from today.  Will stretch out to 10 or 11 days after that.  Cont Gabapentin at night

## 2019-05-26 NOTE — Patient Instructions (Signed)
Acupuncture Acupuncture is a type of treatment that involves stimulating specific points on your body by inserting thin needles through your skin. Acupuncture is often used to treat pain, but it may also be used to help relieve other types of symptoms. Your health care provider may recommend acupuncture to help treat various conditions, such as:  Migraine headaches.  Tension headaches.  Arthritis pain.  Addiction.  Chronic pain.  Nausea and vomiting after a surgery.  High blood pressure (hypertension).  Chronic obstructive pulmonary disease (COPD).  Nausea caused by cancer treatment.  Sudden or severe (acute) pain. Acupuncture is based on traditional Chinese medicine, which recognizes more than 2,000 points on the body that connect energy pathways (meridians) through the body. The goal in stimulating these points is to balance the physical, emotional, and mental energy in your body. Acupuncture is done by a health care provider who has specialized training (licensed acupuncture practitioner). Treatment often requires several acupuncture sessions. You may have acupuncture along with other medical treatments. Tell a health care provider about:  Any allergies you have.  All medicines you are taking, including vitamins, herbs, eye drops, creams, and over-the-counter medicines.  Any blood disorders you have.  Any surgeries you have had.  Any medical conditions you have.  Whether you are pregnant or may be pregnant. What are the risks? Generally, this is a safe treatment. However, problems may occur, including:  Skin infection.  Damage to organs or structures that are under the skin where a needle is placed. What happens before the treatment?  Your acupuncture practitioner will ask about your medical history and your symptoms.  You may have a physical exam. What happens during the treatment? The exact procedure will depend on your condition and how your acupuncture provider  treats it. In general:  Your skin will be cleaned with a germ-killing (antiseptic) solution.  Your acupuncture practitioner will open a new set of germ-free (sterile) needles.  The needles will be gently inserted into your skin. They will be left in place for a certain amount of time. You may feel slight pain or a tingling sensation.  Your acupuncture practitioner may: ? Apply electrical energy to the needles. ? Adjust the needles in certain ways.  After your procedure, the acupuncture practitioner will remove the needles, throw them away, and clean your skin. The procedure may vary among health care providers. What can I expect after the treatment? People react differently to acupuncture. Make sure you ask your acupuncture provider what to expect after your treatment. It is common to have:  Minor bruising.  Mild pain.  A small amount of bleeding. Follow these instructions at home:  Follow any instructions given by your provider after the treatment.  Keep all follow-up visits as told by your health care provider. This is important. Contact a health care provider if:  You have questions about your reaction to the treatment.  You have soreness.  You have skin irritation or redness.  You have a fever. Summary  Acupuncture is a type of treatment that involves stimulating specific points on your body by inserting thin needles through your skin.  This treatment is often used to treat pain, but it may also be used to help relieve other types of symptoms.  The exact procedure will depend on your condition and how your acupuncture provider treats it. This information is not intended to replace advice given to you by your health care provider. Make sure you discuss any questions you have with your health   care provider. Document Revised: 03/27/2017 Document Reviewed: 01/09/2017 Elsevier Patient Education  2020 [employer]  

## 2019-06-03 ENCOUNTER — Encounter: Payer: Self-pay | Admitting: Physical Medicine & Rehabilitation

## 2019-06-03 ENCOUNTER — Other Ambulatory Visit: Payer: Self-pay

## 2019-06-03 ENCOUNTER — Encounter
Payer: No Typology Code available for payment source | Attending: Physical Medicine & Rehabilitation | Admitting: Physical Medicine & Rehabilitation

## 2019-06-03 VITALS — BP 125/80 | HR 84 | Temp 97.7°F | Ht 71.0 in | Wt 239.0 lb

## 2019-06-03 DIAGNOSIS — M961 Postlaminectomy syndrome, not elsewhere classified: Secondary | ICD-10-CM

## 2019-06-03 DIAGNOSIS — M5416 Radiculopathy, lumbar region: Secondary | ICD-10-CM | POA: Diagnosis present

## 2019-06-03 NOTE — Progress Notes (Signed)
Acupuncture treatment #7  Nerve pain in calf RIght side improved for the last 1wk Indication is lumbar postlaminectomy syndrome and chronic right lumbar radiculopathy.  Last treatment 05/19/2019, the patient still notes that his pain level is better than on 05/18/2019.  The maximum effect of the treatment wore off at day 4 or day 5 Technique: Craig/PENS,in combination with n/n+1 treatment ,  acupuncture with electrical stimulation Indication radicular pain right lower extremity postlaminectomy    Trunk It is placed bilaterally at BL 23 and BL 26 , as well as right BL 24, BL 25, Needle placed at GB 30 right side  Right lower limb Right GB 31 right GB 34 and right SP 6 Electrical stimulation 2.5 Hz for 20 minutes  Pt then placed in supine and needles placed ST 36, ST 31 and SP6 , SP-9 Estim SP6-9 for Patient tolerated procedure well  Recommend another 4 treatments assess effect.  Next treatment will be 10 days from today. Cont Gabapentin at night

## 2019-06-03 NOTE — Patient Instructions (Signed)
Acupuncture Acupuncture is a type of treatment that involves stimulating specific points on your body by inserting thin needles through your skin. Acupuncture is often used to treat pain, but it may also be used to help relieve other types of symptoms. Your health care provider may recommend acupuncture to help treat various conditions, such as:  Migraine headaches.  Tension headaches.  Arthritis pain.  Addiction.  Chronic pain.  Nausea and vomiting after a surgery.  High blood pressure (hypertension).  Chronic obstructive pulmonary disease (COPD).  Nausea caused by cancer treatment.  Sudden or severe (acute) pain. Acupuncture is based on traditional Chinese medicine, which recognizes more than 2,000 points on the body that connect energy pathways (meridians) through the body. The goal in stimulating these points is to balance the physical, emotional, and mental energy in your body. Acupuncture is done by a health care provider who has specialized training (licensed acupuncture practitioner). Treatment often requires several acupuncture sessions. You may have acupuncture along with other medical treatments. Tell a health care provider about:  Any allergies you have.  All medicines you are taking, including vitamins, herbs, eye drops, creams, and over-the-counter medicines.  Any blood disorders you have.  Any surgeries you have had.  Any medical conditions you have.  Whether you are pregnant or may be pregnant. What are the risks? Generally, this is a safe treatment. However, problems may occur, including:  Skin infection.  Damage to organs or structures that are under the skin where a needle is placed. What happens before the treatment?  Your acupuncture practitioner will ask about your medical history and your symptoms.  You may have a physical exam. What happens during the treatment? The exact procedure will depend on your condition and how your acupuncture provider  treats it. In general:  Your skin will be cleaned with a germ-killing (antiseptic) solution.  Your acupuncture practitioner will open a new set of germ-free (sterile) needles.  The needles will be gently inserted into your skin. They will be left in place for a certain amount of time. You may feel slight pain or a tingling sensation.  Your acupuncture practitioner may: ? Apply electrical energy to the needles. ? Adjust the needles in certain ways.  After your procedure, the acupuncture practitioner will remove the needles, throw them away, and clean your skin. The procedure may vary among health care providers. What can I expect after the treatment? People react differently to acupuncture. Make sure you ask your acupuncture provider what to expect after your treatment. It is common to have:  Minor bruising.  Mild pain.  A small amount of bleeding. Follow these instructions at home:  Follow any instructions given by your provider after the treatment.  Keep all follow-up visits as told by your health care provider. This is important. Contact a health care provider if:  You have questions about your reaction to the treatment.  You have soreness.  You have skin irritation or redness.  You have a fever. Summary  Acupuncture is a type of treatment that involves stimulating specific points on your body by inserting thin needles through your skin.  This treatment is often used to treat pain, but it may also be used to help relieve other types of symptoms.  The exact procedure will depend on your condition and how your acupuncture provider treats it. This information is not intended to replace advice given to you by your health care provider. Make sure you discuss any questions you have with your health   care provider. Document Revised: 03/27/2017 Document Reviewed: 01/09/2017 Elsevier Patient Education  2020 [employer]  

## 2019-06-14 ENCOUNTER — Encounter (HOSPITAL_BASED_OUTPATIENT_CLINIC_OR_DEPARTMENT_OTHER): Payer: No Typology Code available for payment source | Admitting: Physical Medicine & Rehabilitation

## 2019-06-14 ENCOUNTER — Encounter: Payer: Self-pay | Admitting: Physical Medicine & Rehabilitation

## 2019-06-14 ENCOUNTER — Other Ambulatory Visit: Payer: Self-pay

## 2019-06-14 VITALS — BP 128/83 | HR 82 | Temp 98.0°F | Ht 71.0 in | Wt 236.0 lb

## 2019-06-14 DIAGNOSIS — M5416 Radiculopathy, lumbar region: Secondary | ICD-10-CM | POA: Diagnosis not present

## 2019-06-14 DIAGNOSIS — M961 Postlaminectomy syndrome, not elsewhere classified: Secondary | ICD-10-CM | POA: Diagnosis not present

## 2019-06-14 NOTE — Progress Notes (Signed)
Acupuncture treatment #8  Nerve pain in calf RIght side improved for the last 1wk Indication is lumbar postlaminectomy syndrome and chronic right lumbar radiculopathy.  Last treatment 06/03/2019,  Still some effect, nerve pain RLE improved, still "muscle pain" in Right thigh Technique: Craig/PENS,in combination with n/n+1 treatment ,  acupuncture with electrical stimulation Indication radicular pain right lower extremity postlaminectomy    Trunk It is placed bilaterally at BL 23 and BL 26 , as well as right BL 24, BL 25, Needle placed at GB 30 right side  Right lower limb Right GB 31 right GB 34 and right SP 6 Electrical stimulation 2.5 Hz for 20 minutes  Pt then placed in supine and needles placed ST 36, ST 31 and KI 3 Estim ST31-36 for Patient tolerated procedure well  Recommend another 4 treatments assess effect.  Next treatment will be 10 days from today. Cont Gabapentin at night

## 2019-06-24 ENCOUNTER — Other Ambulatory Visit: Payer: Self-pay

## 2019-06-24 ENCOUNTER — Encounter
Payer: No Typology Code available for payment source | Attending: Physical Medicine & Rehabilitation | Admitting: Physical Medicine & Rehabilitation

## 2019-06-24 ENCOUNTER — Encounter: Payer: Self-pay | Admitting: Physical Medicine & Rehabilitation

## 2019-06-24 VITALS — BP 133/79 | HR 80 | Temp 97.7°F | Ht 71.0 in | Wt 235.0 lb

## 2019-06-24 DIAGNOSIS — M961 Postlaminectomy syndrome, not elsewhere classified: Secondary | ICD-10-CM | POA: Insufficient documentation

## 2019-06-24 DIAGNOSIS — M5416 Radiculopathy, lumbar region: Secondary | ICD-10-CM | POA: Diagnosis not present

## 2019-06-24 NOTE — Progress Notes (Signed)
Acupuncture treatment #8  Nerve pain in calf RIght side improved for the last 1wk Indication is lumbar postlaminectomy syndrome and chronic right lumbar radiculopathy.  Last treatment 06/03/2019,  Still some effect, nerve pain RLE improved, still "muscle pain" in Right thigh Technique: Craig/PENS,in combination with n/n+1 treatment ,  acupuncture with electrical stimulation Indication radicular pain right lower extremity postlaminectomy    Trunk It is placed bilaterally at BL 23 and BL 26 , as well as right BL 24, BL 25, Needle placed at GB 30 right side  Right lower limb Right GB 31 right GB 34 and right SP 6 Electrical stimulation 2.5 Hz for 20 minutes  Pt then placed in supine and needles placed ST 36, ST 31 and SP 6 Estim ST31-36 for Patient tolerated procedure well    Next treatment will be 12 days from today.  He will have another treatment on 07/19/2019.  I recommend stretching out visits to every 2 weeks x 4 visits starting in April.  Cont Gabapentin at night

## 2019-07-07 ENCOUNTER — Encounter: Payer: Self-pay | Admitting: Physical Medicine & Rehabilitation

## 2019-07-07 ENCOUNTER — Other Ambulatory Visit: Payer: Self-pay

## 2019-07-07 ENCOUNTER — Encounter
Payer: No Typology Code available for payment source | Attending: Physical Medicine & Rehabilitation | Admitting: Physical Medicine & Rehabilitation

## 2019-07-07 VITALS — BP 125/83 | HR 78 | Temp 97.7°F | Resp 14 | Ht 71.0 in | Wt 233.0 lb

## 2019-07-07 DIAGNOSIS — M5416 Radiculopathy, lumbar region: Secondary | ICD-10-CM | POA: Diagnosis not present

## 2019-07-07 DIAGNOSIS — M961 Postlaminectomy syndrome, not elsewhere classified: Secondary | ICD-10-CM | POA: Insufficient documentation

## 2019-07-07 NOTE — Progress Notes (Signed)
Acupuncture treatment #9  Walking tolerance has improved since the beginning of treatment.  He is now able to comfortably walk 10 minutes on most days and 15 minutes on good days.  His previous walking tolerance prior to the initiation of acupuncture treatment was less than 5 minutes Indication is lumbar postlaminectomy syndrome and chronic right lumbar radiculopathy.  Last treatment 06/03/2019,  Still some effect, nerve pain RLE improved, still "muscle pain" in Right thigh Technique: Craig/PENS,in combination with n/n+1 treatment ,  acupuncture with electrical stimulation Indication radicular pain right lower extremity postlaminectomy    Trunk It is placed bilaterally at BL 23 and BL 26 , as well as right BL 24, BL 25, Needle placed at GB 30 right side  Right lower limb Right GB 31 right GB 34 and right SP 6 Electrical stimulation 2.5 Hz for 20 minutes  Pt then placed in supine and needles placed ST 36, ST 31 and SP 6 Estim ST31-36 for Patient tolerated procedure well    Next treatment will be 12 days from today on 07/19/2019.  I recommend stretching out visits to every 2 weeks x 4 visits starting in April.  Cont Gabapentin at night

## 2019-07-07 NOTE — Patient Instructions (Signed)
Acupuncture Acupuncture is a type of treatment that involves stimulating specific points on your body by inserting thin needles through your skin. Acupuncture is often used to treat pain, but it may also be used to help relieve other types of symptoms. Your health care provider may recommend acupuncture to help treat various conditions, such as:  Migraine headaches.  Tension headaches.  Arthritis pain.  Addiction.  Chronic pain.  Nausea and vomiting after a surgery.  High blood pressure (hypertension).  Chronic obstructive pulmonary disease (COPD).  Nausea caused by cancer treatment.  Sudden or severe (acute) pain. Acupuncture is based on traditional Chinese medicine, which recognizes more than 2,000 points on the body that connect energy pathways (meridians) through the body. The goal in stimulating these points is to balance the physical, emotional, and mental energy in your body. Acupuncture is done by a health care provider who has specialized training (licensed acupuncture practitioner). Treatment often requires several acupuncture sessions. You may have acupuncture along with other medical treatments. Tell a health care provider about:  Any allergies you have.  All medicines you are taking, including vitamins, herbs, eye drops, creams, and over-the-counter medicines.  Any blood disorders you have.  Any surgeries you have had.  Any medical conditions you have.  Whether you are pregnant or may be pregnant. What are the risks? Generally, this is a safe treatment. However, problems may occur, including:  Skin infection.  Damage to organs or structures that are under the skin where a needle is placed. What happens before the treatment?  Your acupuncture practitioner will ask about your medical history and your symptoms.  You may have a physical exam. What happens during the treatment? The exact procedure will depend on your condition and how your acupuncture provider  treats it. In general:  Your skin will be cleaned with a germ-killing (antiseptic) solution.  Your acupuncture practitioner will open a new set of germ-free (sterile) needles.  The needles will be gently inserted into your skin. They will be left in place for a certain amount of time. You may feel slight pain or a tingling sensation.  Your acupuncture practitioner may: ? Apply electrical energy to the needles. ? Adjust the needles in certain ways.  After your procedure, the acupuncture practitioner will remove the needles, throw them away, and clean your skin. The procedure may vary among health care providers. What can I expect after the treatment? People react differently to acupuncture. Make sure you ask your acupuncture provider what to expect after your treatment. It is common to have:  Minor bruising.  Mild pain.  A small amount of bleeding. Follow these instructions at home:  Follow any instructions given by your provider after the treatment.  Keep all follow-up visits as told by your health care provider. This is important. Contact a health care provider if:  You have questions about your reaction to the treatment.  You have soreness.  You have skin irritation or redness.  You have a fever. Summary  Acupuncture is a type of treatment that involves stimulating specific points on your body by inserting thin needles through your skin.  This treatment is often used to treat pain, but it may also be used to help relieve other types of symptoms.  The exact procedure will depend on your condition and how your acupuncture provider treats it. This information is not intended to replace advice given to you by your health care provider. Make sure you discuss any questions you have with your health   care provider. Document Revised: 03/27/2017 Document Reviewed: 01/09/2017 Elsevier Patient Education  2020 [employer]  

## 2019-07-19 ENCOUNTER — Other Ambulatory Visit: Payer: Self-pay

## 2019-07-19 ENCOUNTER — Encounter: Payer: Self-pay | Admitting: Physical Medicine & Rehabilitation

## 2019-07-19 ENCOUNTER — Encounter
Payer: No Typology Code available for payment source | Attending: Physical Medicine & Rehabilitation | Admitting: Physical Medicine & Rehabilitation

## 2019-07-19 VITALS — BP 129/85 | HR 72 | Temp 97.5°F | Ht 71.0 in | Wt 236.0 lb

## 2019-07-19 DIAGNOSIS — M5416 Radiculopathy, lumbar region: Secondary | ICD-10-CM | POA: Diagnosis not present

## 2019-07-19 DIAGNOSIS — M961 Postlaminectomy syndrome, not elsewhere classified: Secondary | ICD-10-CM | POA: Diagnosis not present

## 2019-07-19 NOTE — Progress Notes (Signed)
Acupuncture treatment #10   Indication is lumbar postlaminectomy syndrome and chronic right lumbar radiculopathy.  Last treatment 06/03/2019,  Still some effect, nerve pain RLE improved, still "muscle pain" in Right thigh Technique: Craig/PENS,in combination with n/n+1 treatment ,  acupuncture with electrical stimulation Indication radicular pain right lower extremity postlaminectomy    Trunk It is placed bilaterally at BL 23 and BL 26 , as well as right BL 24, BL 25, Needle placed at GB 30 right side  Right lower limb Right GB 31 right GB 34 and right SP 6 Electrical stimulation 2.5 Hz for 20 minutes  Pt then placed in supine and needles placed ST 36, ST 31 and SP 6 Estim ST31-36 for Patient tolerated procedure well    Next treatment will be 14 days from today on 08/02/2019

## 2019-07-31 ENCOUNTER — Other Ambulatory Visit: Payer: Self-pay | Admitting: Physical Medicine & Rehabilitation

## 2019-08-02 ENCOUNTER — Encounter: Payer: Self-pay | Admitting: Physical Medicine & Rehabilitation

## 2019-08-02 ENCOUNTER — Other Ambulatory Visit: Payer: Self-pay

## 2019-08-02 ENCOUNTER — Encounter
Payer: No Typology Code available for payment source | Attending: Physical Medicine & Rehabilitation | Admitting: Physical Medicine & Rehabilitation

## 2019-08-02 VITALS — BP 122/82 | HR 79 | Ht 71.0 in | Wt 234.0 lb

## 2019-08-02 DIAGNOSIS — M5416 Radiculopathy, lumbar region: Secondary | ICD-10-CM | POA: Insufficient documentation

## 2019-08-02 DIAGNOSIS — M961 Postlaminectomy syndrome, not elsewhere classified: Secondary | ICD-10-CM | POA: Diagnosis present

## 2019-08-02 NOTE — Progress Notes (Signed)
Acupuncture treatment #11   Indication is lumbar postlaminectomy syndrome and chronic right lumbar radiculopathy.  Last treatment 07/19/2019 Still some effect, nerve pain RLE improved, still "muscle pain" in Right thigh Technique: Craig/PENS,in combination with n/n+1 treatment ,  acupuncture with electrical stimulation Indication radicular pain right lower extremity postlaminectomy    Trunk It is placed bilaterally at BL 23 and BL 26 , as well as right BL 24, BL 25, Needle placed at GB 30 right side  Right lower limb Right GB 31 right GB 34 and right SP 6 Electrical stimulation 2.5 Hz for 20 minutes  Pt then placed in supine and needles placed ST 36, ST 31 and SP 6 Estim ST31-36 for Patient tolerated procedure well

## 2019-08-02 NOTE — Patient Instructions (Signed)
Acupuncture Acupuncture is a type of treatment that involves stimulating specific points on your body by inserting thin needles through your skin. Acupuncture is often used to treat pain, but it may also be used to help relieve other types of symptoms. Your health care provider may recommend acupuncture to help treat various conditions, such as:  Migraine headaches.  Tension headaches.  Arthritis pain.  Addiction.  Chronic pain.  Nausea and vomiting after a surgery.  High blood pressure (hypertension).  Chronic obstructive pulmonary disease (COPD).  Nausea caused by cancer treatment.  Sudden or severe (acute) pain. Acupuncture is based on traditional Chinese medicine, which recognizes more than 2,000 points on the body that connect energy pathways (meridians) through the body. The goal in stimulating these points is to balance the physical, emotional, and mental energy in your body. Acupuncture is done by a health care provider who has specialized training (licensed acupuncture practitioner). Treatment often requires several acupuncture sessions. You may have acupuncture along with other medical treatments. Tell a health care provider about:  Any allergies you have.  All medicines you are taking, including vitamins, herbs, eye drops, creams, and over-the-counter medicines.  Any blood disorders you have.  Any surgeries you have had.  Any medical conditions you have.  Whether you are pregnant or may be pregnant. What are the risks? Generally, this is a safe treatment. However, problems may occur, including:  Skin infection.  Damage to organs or structures that are under the skin where a needle is placed. What happens before the treatment?  Your acupuncture practitioner will ask about your medical history and your symptoms.  You may have a physical exam. What happens during the treatment? The exact procedure will depend on your condition and how your acupuncture provider  treats it. In general:  Your skin will be cleaned with a germ-killing (antiseptic) solution.  Your acupuncture practitioner will open a new set of germ-free (sterile) needles.  The needles will be gently inserted into your skin. They will be left in place for a certain amount of time. You may feel slight pain or a tingling sensation.  Your acupuncture practitioner may: ? Apply electrical energy to the needles. ? Adjust the needles in certain ways.  After your procedure, the acupuncture practitioner will remove the needles, throw them away, and clean your skin. The procedure may vary among health care providers. What can I expect after the treatment? People react differently to acupuncture. Make sure you ask your acupuncture provider what to expect after your treatment. It is common to have:  Minor bruising.  Mild pain.  A small amount of bleeding. Follow these instructions at home:  Follow any instructions given by your provider after the treatment.  Keep all follow-up visits as told by your health care provider. This is important. Contact a health care provider if:  You have questions about your reaction to the treatment.  You have soreness.  You have skin irritation or redness.  You have a fever. Summary  Acupuncture is a type of treatment that involves stimulating specific points on your body by inserting thin needles through your skin.  This treatment is often used to treat pain, but it may also be used to help relieve other types of symptoms.  The exact procedure will depend on your condition and how your acupuncture provider treats it. This information is not intended to replace advice given to you by your health care provider. Make sure you discuss any questions you have with your health   care provider. Document Revised: 03/27/2017 Document Reviewed: 01/09/2017 Elsevier Patient Education  2020 [employer]  

## 2019-08-16 ENCOUNTER — Ambulatory Visit: Payer: BC Managed Care – PPO | Admitting: Physical Medicine & Rehabilitation

## 2019-08-19 ENCOUNTER — Ambulatory Visit: Payer: BC Managed Care – PPO | Admitting: Physical Medicine & Rehabilitation

## 2019-08-30 ENCOUNTER — Other Ambulatory Visit: Payer: Self-pay

## 2019-08-30 ENCOUNTER — Encounter
Payer: No Typology Code available for payment source | Attending: Physical Medicine & Rehabilitation | Admitting: Physical Medicine & Rehabilitation

## 2019-08-30 ENCOUNTER — Encounter: Payer: Self-pay | Admitting: Physical Medicine & Rehabilitation

## 2019-08-30 DIAGNOSIS — M961 Postlaminectomy syndrome, not elsewhere classified: Secondary | ICD-10-CM | POA: Insufficient documentation

## 2019-08-30 DIAGNOSIS — M5416 Radiculopathy, lumbar region: Secondary | ICD-10-CM | POA: Diagnosis not present

## 2019-08-30 NOTE — Patient Instructions (Signed)
Acupuncture Acupuncture is a type of treatment that involves stimulating specific points on your body by inserting thin needles through your skin. Acupuncture is often used to treat pain, but it may also be used to help relieve other types of symptoms. Your health care provider may recommend acupuncture to help treat various conditions, such as:  Migraine headaches.  Tension headaches.  Arthritis pain.  Addiction.  Chronic pain.  Nausea and vomiting after a surgery.  High blood pressure (hypertension).  Chronic obstructive pulmonary disease (COPD).  Nausea caused by cancer treatment.  Sudden or severe (acute) pain. Acupuncture is based on traditional Chinese medicine, which recognizes more than 2,000 points on the body that connect energy pathways (meridians) through the body. The goal in stimulating these points is to balance the physical, emotional, and mental energy in your body. Acupuncture is done by a health care provider who has specialized training (licensed acupuncture practitioner). Treatment often requires several acupuncture sessions. You may have acupuncture along with other medical treatments. Tell a health care provider about:  Any allergies you have.  All medicines you are taking, including vitamins, herbs, eye drops, creams, and over-the-counter medicines.  Any blood disorders you have.  Any surgeries you have had.  Any medical conditions you have.  Whether you are pregnant or may be pregnant. What are the risks? Generally, this is a safe treatment. However, problems may occur, including:  Skin infection.  Damage to organs or structures that are under the skin where a needle is placed. What happens before the treatment?  Your acupuncture practitioner will ask about your medical history and your symptoms.  You may have a physical exam. What happens during the treatment? The exact procedure will depend on your condition and how your acupuncture provider  treats it. In general:  Your skin will be cleaned with a germ-killing (antiseptic) solution.  Your acupuncture practitioner will open a new set of germ-free (sterile) needles.  The needles will be gently inserted into your skin. They will be left in place for a certain amount of time. You may feel slight pain or a tingling sensation.  Your acupuncture practitioner may: ? Apply electrical energy to the needles. ? Adjust the needles in certain ways.  After your procedure, the acupuncture practitioner will remove the needles, throw them away, and clean your skin. The procedure may vary among health care providers. What can I expect after the treatment? People react differently to acupuncture. Make sure you ask your acupuncture provider what to expect after your treatment. It is common to have:  Minor bruising.  Mild pain.  A small amount of bleeding. Follow these instructions at home:  Follow any instructions given by your provider after the treatment.  Keep all follow-up visits as told by your health care provider. This is important. Contact a health care provider if:  You have questions about your reaction to the treatment.  You have soreness.  You have skin irritation or redness.  You have a fever. Summary  Acupuncture is a type of treatment that involves stimulating specific points on your body by inserting thin needles through your skin.  This treatment is often used to treat pain, but it may also be used to help relieve other types of symptoms.  The exact procedure will depend on your condition and how your acupuncture provider treats it. This information is not intended to replace advice given to you by your health care provider. Make sure you discuss any questions you have with your health   care provider. Document Revised: 03/27/2017 Document Reviewed: 01/09/2017 Elsevier Patient Education  2020 [employer]  

## 2019-08-30 NOTE — Progress Notes (Signed)
Acupuncture treatment #12   Indication is lumbar postlaminectomy syndrome and chronic right lumbar radiculopathy.  Last treatment 08/02/2019 Still some effect,mainly in the last two weeks.  Technique: Craig/PENS,in combination with n/n+1 treatment ,  acupuncture with electrical stimulation Indication radicular pain right lower extremity postlaminectomy    Trunk It is placed bilaterally at BL 23 and BL 26 , as well as right BL 24, BL 25, Needle placed at GB 30 right side  Right lower limb Right GB 31 right GB 34 and right SP 6 Electrical stimulation 2.5 Hz for 20 minutes  Pt then placed in supine and needles placed ST 36, ST 31 and SP 6 Estim ST31-36 for Patient tolerated procedure well  Repeat in 2 wks

## 2019-09-15 ENCOUNTER — Other Ambulatory Visit: Payer: Self-pay

## 2019-09-15 ENCOUNTER — Encounter (HOSPITAL_BASED_OUTPATIENT_CLINIC_OR_DEPARTMENT_OTHER): Payer: No Typology Code available for payment source | Admitting: Physical Medicine & Rehabilitation

## 2019-09-15 ENCOUNTER — Encounter: Payer: Self-pay | Admitting: Physical Medicine & Rehabilitation

## 2019-09-15 VITALS — BP 124/82 | HR 83 | Temp 97.7°F | Ht 71.0 in | Wt 231.0 lb

## 2019-09-15 DIAGNOSIS — M5416 Radiculopathy, lumbar region: Secondary | ICD-10-CM | POA: Diagnosis not present

## 2019-09-15 DIAGNOSIS — M961 Postlaminectomy syndrome, not elsewhere classified: Secondary | ICD-10-CM | POA: Diagnosis not present

## 2019-09-15 NOTE — Progress Notes (Signed)
Acupuncture treatment #12   Indication is lumbar postlaminectomy syndrome and chronic right lumbar radiculopathy.  Last treatment 08/30/2019 Still some effect,mainly in the last two weeks.  The nerve pain shooting down the right leg has improved.  Still has right thigh pain which has not improved as much.  We discussed possibility of physical therapy for myofascial release right thigh area.  Technique: Craig/PENS,in combination with n/n+1 treatment ,  acupuncture with electrical stimulation Indication radicular pain right lower extremity postlaminectomy    Trunk It is placed bilaterally at BL 23 and BL 26 , as well as right BL 24, BL 25, Needle placed at GB 30 right side  Right lower limb Right GB 31 right GB 34 and right SP 6 Electrical stimulation 2.5 Hz for 20 minutes  Pt then placed in supine and needles placed ST 36, ST 31 and SP 6 Estim ST31-36 for Patient tolerated procedure well  Repeat in 2 wks

## 2019-09-29 ENCOUNTER — Other Ambulatory Visit: Payer: Self-pay

## 2019-09-29 ENCOUNTER — Encounter
Payer: No Typology Code available for payment source | Attending: Physical Medicine & Rehabilitation | Admitting: Physical Medicine & Rehabilitation

## 2019-09-29 ENCOUNTER — Encounter: Payer: Self-pay | Admitting: Physical Medicine & Rehabilitation

## 2019-09-29 VITALS — BP 120/78 | HR 81 | Temp 98.9°F | Ht 71.0 in | Wt 234.0 lb

## 2019-09-29 DIAGNOSIS — M961 Postlaminectomy syndrome, not elsewhere classified: Secondary | ICD-10-CM | POA: Diagnosis present

## 2019-09-29 DIAGNOSIS — M5416 Radiculopathy, lumbar region: Secondary | ICD-10-CM | POA: Diagnosis present

## 2019-09-29 NOTE — Patient Instructions (Signed)
Acupuncture Acupuncture is a type of treatment that involves stimulating specific points on your body by inserting thin needles through your skin. Acupuncture is often used to treat pain, but it may also be used to help relieve other types of symptoms. Your health care provider may recommend acupuncture to help treat various conditions, such as:  Migraine headaches.  Tension headaches.  Arthritis pain.  Addiction.  Chronic pain.  Nausea and vomiting after a surgery.  High blood pressure (hypertension).  Chronic obstructive pulmonary disease (COPD).  Nausea caused by cancer treatment.  Sudden or severe (acute) pain. Acupuncture is based on traditional Chinese medicine, which recognizes more than 2,000 points on the body that connect energy pathways (meridians) through the body. The goal in stimulating these points is to balance the physical, emotional, and mental energy in your body. Acupuncture is done by a health care provider who has specialized training (licensed acupuncture practitioner). Treatment often requires several acupuncture sessions. You may have acupuncture along with other medical treatments. Tell a health care provider about:  Any allergies you have.  All medicines you are taking, including vitamins, herbs, eye drops, creams, and over-the-counter medicines.  Any blood disorders you have.  Any surgeries you have had.  Any medical conditions you have.  Whether you are pregnant or may be pregnant. What are the risks? Generally, this is a safe treatment. However, problems may occur, including:  Skin infection.  Damage to organs or structures that are under the skin where a needle is placed. What happens before the treatment?  Your acupuncture practitioner will ask about your medical history and your symptoms.  You may have a physical exam. What happens during the treatment? The exact procedure will depend on your condition and how your acupuncture provider  treats it. In general:  Your skin will be cleaned with a germ-killing (antiseptic) solution.  Your acupuncture practitioner will open a new set of germ-free (sterile) needles.  The needles will be gently inserted into your skin. They will be left in place for a certain amount of time. You may feel slight pain or a tingling sensation.  Your acupuncture practitioner may: ? Apply electrical energy to the needles. ? Adjust the needles in certain ways.  After your procedure, the acupuncture practitioner will remove the needles, throw them away, and clean your skin. The procedure may vary among health care providers. What can I expect after the treatment? People react differently to acupuncture. Make sure you ask your acupuncture provider what to expect after your treatment. It is common to have:  Minor bruising.  Mild pain.  A small amount of bleeding. Follow these instructions at home:  Follow any instructions given by your provider after the treatment.  Keep all follow-up visits as told by your health care provider. This is important. Contact a health care provider if:  You have questions about your reaction to the treatment.  You have soreness.  You have skin irritation or redness.  You have a fever. Summary  Acupuncture is a type of treatment that involves stimulating specific points on your body by inserting thin needles through your skin.  This treatment is often used to treat pain, but it may also be used to help relieve other types of symptoms.  The exact procedure will depend on your condition and how your acupuncture provider treats it. This information is not intended to replace advice given to you by your health care provider. Make sure you discuss any questions you have with your health   care provider. Document Revised: 03/27/2017 Document Reviewed: 01/09/2017 Elsevier Patient Education  2020 [employer]  

## 2019-09-29 NOTE — Progress Notes (Signed)
Acupuncture treatment #13   Indication is lumbar postlaminectomy syndrome and chronic right lumbar radiculopathy.  Last treatment 09/15/2019 Still some effect,mainly   The nerve pain shooting down the right leg has improved.  Still has right thigh pain which has not improved as much.  We discussed possibility of physical therapy for myofascial release right thigh area.  Technique: Craig/PENS,in combination with n/n+1 treatment ,  acupuncture with electrical stimulation Indication radicular pain right lower extremity postlaminectomy    Trunk It is placed bilaterally at BL 23 and BL 26 , as well as right BL 24, BL 25, Needle placed at GB 30 right side  Right lower limb Right GB 31 right GB 34 and right SP 6 Electrical stimulation 2.5 Hz for 20 minutes  Pt then placed in supine and needles placed ST 36, ST 31 and SP 6 Estim ST31-36 for Patient tolerated procedure well  Repeat in 3 wks x 2 treatments, hopefully will be able to extend time in between treatments

## 2019-10-14 ENCOUNTER — Encounter (HOSPITAL_BASED_OUTPATIENT_CLINIC_OR_DEPARTMENT_OTHER): Payer: No Typology Code available for payment source | Admitting: Physical Medicine & Rehabilitation

## 2019-10-14 ENCOUNTER — Encounter: Payer: Self-pay | Admitting: Physical Medicine & Rehabilitation

## 2019-10-14 ENCOUNTER — Other Ambulatory Visit: Payer: Self-pay

## 2019-10-14 VITALS — BP 133/84 | HR 75 | Temp 97.7°F | Ht 71.0 in | Wt 234.0 lb

## 2019-10-14 DIAGNOSIS — M5416 Radiculopathy, lumbar region: Secondary | ICD-10-CM

## 2019-10-14 DIAGNOSIS — M961 Postlaminectomy syndrome, not elsewhere classified: Secondary | ICD-10-CM | POA: Diagnosis not present

## 2019-10-14 NOTE — Progress Notes (Signed)
Acupuncture treatment #14   Indication is lumbar postlaminectomy syndrome and chronic right lumbar radiculopathy.  Last treatment6/06/2019 Still some effect,mainly   The nerve pain shooting down the right leg has improved.  Still has right thigh pain which has not improved as much.  We discussed possibility of physical therapy for myofascial release right thigh area.  Technique: Craig/PENS,in combination with n/n+1 treatment ,  acupuncture with electrical stimulation Indication radicular pain right lower extremity postlaminectomy    Trunk It is placed bilaterally at BL 23 and BL 26 , as well as right BL 24, BL 25, Needle placed at GB 30 right side  Right lower limb Right GB 31 right GB 34 and right SP 6 Electrical stimulation 2.5 Hz for 20 minutes  Pt then placed in supine and needles placed ST 36, ST 31 and KI 3 Estim ST31-36 for Patient tolerated procedure well  Repeat in 3 wks x 2 treatments, hopefully will be able to extend time in between treatments

## 2019-11-04 ENCOUNTER — Encounter
Payer: No Typology Code available for payment source | Attending: Physical Medicine & Rehabilitation | Admitting: Physical Medicine & Rehabilitation

## 2019-11-04 ENCOUNTER — Other Ambulatory Visit: Payer: Self-pay

## 2019-11-04 ENCOUNTER — Encounter: Payer: Self-pay | Admitting: Physical Medicine & Rehabilitation

## 2019-11-04 VITALS — Temp 98.3°F | Ht 71.0 in | Wt 239.0 lb

## 2019-11-04 DIAGNOSIS — M5416 Radiculopathy, lumbar region: Secondary | ICD-10-CM | POA: Insufficient documentation

## 2019-11-04 DIAGNOSIS — M961 Postlaminectomy syndrome, not elsewhere classified: Secondary | ICD-10-CM | POA: Diagnosis present

## 2019-11-04 NOTE — Patient Instructions (Signed)
Acupuncture Acupuncture is a type of treatment that involves stimulating specific points on your body by inserting thin needles through your skin. Acupuncture is often used to treat pain, but it may also be used to help relieve other types of symptoms. Your health care provider may recommend acupuncture to help treat various conditions, such as:  Migraine headaches.  Tension headaches.  Arthritis pain.  Addiction.  Chronic pain.  Nausea and vomiting after a surgery.  High blood pressure (hypertension).  Chronic obstructive pulmonary disease (COPD).  Nausea caused by cancer treatment.  Sudden or severe (acute) pain. Acupuncture is based on traditional Chinese medicine, which recognizes more than 2,000 points on the body that connect energy pathways (meridians) through the body. The goal in stimulating these points is to balance the physical, emotional, and mental energy in your body. Acupuncture is done by a health care provider who has specialized training (licensed acupuncture practitioner). Treatment often requires several acupuncture sessions. You may have acupuncture along with other medical treatments. Tell a health care provider about:  Any allergies you have.  All medicines you are taking, including vitamins, herbs, eye drops, creams, and over-the-counter medicines.  Any blood disorders you have.  Any surgeries you have had.  Any medical conditions you have.  Whether you are pregnant or may be pregnant. What are the risks? Generally, this is a safe treatment. However, problems may occur, including:  Skin infection.  Damage to organs or structures that are under the skin where a needle is placed. What happens before the treatment?  Your acupuncture practitioner will ask about your medical history and your symptoms.  You may have a physical exam. What happens during the treatment? The exact procedure will depend on your condition and how your acupuncture provider  treats it. In general:  Your skin will be cleaned with a germ-killing (antiseptic) solution.  Your acupuncture practitioner will open a new set of germ-free (sterile) needles.  The needles will be gently inserted into your skin. They will be left in place for a certain amount of time. You may feel slight pain or a tingling sensation.  Your acupuncture practitioner may: ? Apply electrical energy to the needles. ? Adjust the needles in certain ways.  After your procedure, the acupuncture practitioner will remove the needles, throw them away, and clean your skin. The procedure may vary among health care providers. What can I expect after the treatment? People react differently to acupuncture. Make sure you ask your acupuncture provider what to expect after your treatment. It is common to have:  Minor bruising.  Mild pain.  A small amount of bleeding. Follow these instructions at home:  Follow any instructions given by your provider after the treatment.  Keep all follow-up visits as told by your health care provider. This is important. Contact a health care provider if:  You have questions about your reaction to the treatment.  You have soreness.  You have skin irritation or redness.  You have a fever. Summary  Acupuncture is a type of treatment that involves stimulating specific points on your body by inserting thin needles through your skin.  This treatment is often used to treat pain, but it may also be used to help relieve other types of symptoms.  The exact procedure will depend on your condition and how your acupuncture provider treats it. This information is not intended to replace advice given to you by your health care provider. Make sure you discuss any questions you have with your health   care provider. Document Revised: 03/27/2017 Document Reviewed: 01/09/2017 Elsevier Patient Education  2020 [employer]  

## 2019-11-04 NOTE — Progress Notes (Signed)
Acupuncture treatment #15   Indication is lumbar postlaminectomy syndrome and chronic right lumbar radiculopathy.  Last treatment 10/14/2019, the patient had good pain relief for about 2 weeks but then needed to resume gabapentin 100 mg at night technique: Craig/PENS,in combination with n/n+1 treatment ,  acupuncture with electrical stimulation Indication radicular pain right lower extremity postlaminectomy    Trunk It is placed bilaterally at BL 23 and BL 26 , as well as right BL 24, BL 25, Needle placed at GB 30 right side  Right lower limb Right GB 31 right GB 34 and right SP 6 Electrical stimulation 2.5 Hz for 20 minutes  Pt then placed in supine and needles placed ST 36, ST 31 and KI 3 Estim ST31-36 for Patient tolerated procedure well  Repeat in 3 wks x 1 more treatments, hopefully will be able to extend time in between treatments

## 2019-11-25 ENCOUNTER — Other Ambulatory Visit: Payer: Self-pay

## 2019-11-25 ENCOUNTER — Encounter: Payer: Self-pay | Admitting: Physical Medicine & Rehabilitation

## 2019-11-25 ENCOUNTER — Encounter
Payer: No Typology Code available for payment source | Attending: Physical Medicine & Rehabilitation | Admitting: Physical Medicine & Rehabilitation

## 2019-11-25 VITALS — BP 121/80 | HR 77 | Temp 98.6°F | Ht 71.0 in | Wt 235.0 lb

## 2019-11-25 DIAGNOSIS — M961 Postlaminectomy syndrome, not elsewhere classified: Secondary | ICD-10-CM

## 2019-11-25 DIAGNOSIS — M5416 Radiculopathy, lumbar region: Secondary | ICD-10-CM | POA: Insufficient documentation

## 2019-11-25 NOTE — Progress Notes (Signed)
  Overall did ok this last 3 wks, standing /walking tolerance ~31min, sitting tolerance ~20 min  .  Works Mon [facility], [place], Fri  Acupuncture treatment #16   Indication is lumbar postlaminectomy syndrome and chronic right lumbar radiculopathy.  Last treatment 10/14/2019, the patient had good pain relief for about 2 weeks but then needed to resume gabapentin 100 mg at night technique: Craig/PENS,in combination with n/n+1 treatment ,  acupuncture with electrical stimulation Indication radicular pain right lower extremity postlaminectomy    Trunk It is placed bilaterally at BL 23 and BL 26 , as well as right BL 24, BL 25, Needle placed at GB 30 right side  Right lower limb Right GB 31 right GB 34 and right SP 6 Electrical stimulation 2.5 Hz for 20 minutes  Pt then placed in supine and needles placed ST 36, ST 31 and KI 3 Estim ST31-36 for Patient tolerated procedure well  Repeat in 4 wks, hopefully will be able to extend time in between treatments, recommend 3 more visits

## 2019-11-28 ENCOUNTER — Other Ambulatory Visit: Payer: Self-pay | Admitting: Physical Medicine & Rehabilitation

## 2019-12-23 ENCOUNTER — Other Ambulatory Visit: Payer: Self-pay

## 2019-12-23 ENCOUNTER — Encounter: Payer: Self-pay | Admitting: Physical Medicine & Rehabilitation

## 2019-12-23 ENCOUNTER — Encounter
Payer: No Typology Code available for payment source | Attending: Physical Medicine & Rehabilitation | Admitting: Physical Medicine & Rehabilitation

## 2019-12-23 VITALS — BP 135/86 | HR 78 | Temp 97.9°F | Ht 71.0 in | Wt 235.4 lb

## 2019-12-23 DIAGNOSIS — M961 Postlaminectomy syndrome, not elsewhere classified: Secondary | ICD-10-CM | POA: Diagnosis not present

## 2019-12-23 DIAGNOSIS — M5416 Radiculopathy, lumbar region: Secondary | ICD-10-CM | POA: Diagnosis not present

## 2019-12-23 NOTE — Patient Instructions (Signed)
Acupuncture Acupuncture is a type of treatment that involves stimulating specific points on your body by inserting thin needles through your skin. Acupuncture is often used to treat pain, but it may also be used to help relieve other types of symptoms. Your health care provider may recommend acupuncture to help treat various conditions, such as:  Migraine headaches.  Tension headaches.  Arthritis pain.  Addiction.  Chronic pain.  Nausea and vomiting after a surgery.  High blood pressure (hypertension).  Chronic obstructive pulmonary disease (COPD).  Nausea caused by cancer treatment.  Sudden or severe (acute) pain. Acupuncture is based on traditional Chinese medicine, which recognizes more than 2,000 points on the body that connect energy pathways (meridians) through the body. The goal in stimulating these points is to balance the physical, emotional, and mental energy in your body. Acupuncture is done by a health care provider who has specialized training (licensed acupuncture practitioner). Treatment often requires several acupuncture sessions. You may have acupuncture along with other medical treatments. Tell a health care provider about:  Any allergies you have.  All medicines you are taking, including vitamins, herbs, eye drops, creams, and over-the-counter medicines.  Any blood disorders you have.  Any surgeries you have had.  Any medical conditions you have.  Whether you are pregnant or may be pregnant. What are the risks? Generally, this is a safe treatment. However, problems may occur, including:  Skin infection.  Damage to organs or structures that are under the skin where a needle is placed. What happens before the treatment?  Your acupuncture practitioner will ask about your medical history and your symptoms.  You may have a physical exam. What happens during the treatment? The exact procedure will depend on your condition and how your acupuncture provider  treats it. In general:  Your skin will be cleaned with a germ-killing (antiseptic) solution.  Your acupuncture practitioner will open a new set of germ-free (sterile) needles.  The needles will be gently inserted into your skin. They will be left in place for a certain amount of time. You may feel slight pain or a tingling sensation.  Your acupuncture practitioner may: ? Apply electrical energy to the needles. ? Adjust the needles in certain ways.  After your procedure, the acupuncture practitioner will remove the needles, throw them away, and clean your skin. The procedure may vary among health care providers. What can I expect after the treatment? People react differently to acupuncture. Make sure you ask your acupuncture provider what to expect after your treatment. It is common to have:  Minor bruising.  Mild pain.  A small amount of bleeding. Follow these instructions at home:  Follow any instructions given by your provider after the treatment.  Keep all follow-up visits as told by your health care provider. This is important. Contact a health care provider if:  You have questions about your reaction to the treatment.  You have soreness.  You have skin irritation or redness.  You have a fever. Summary  Acupuncture is a type of treatment that involves stimulating specific points on your body by inserting thin needles through your skin.  This treatment is often used to treat pain, but it may also be used to help relieve other types of symptoms.  The exact procedure will depend on your condition and how your acupuncture provider treats it. This information is not intended to replace advice given to you by your health care provider. Make sure you discuss any questions you have with your health   care provider. Document Revised: 03/27/2017 Document Reviewed: 01/09/2017 Elsevier Patient Education  2020 [employer]  

## 2019-12-23 NOTE — Progress Notes (Signed)
  Overall did ok this last 3 wks, standing /walking tolerance ~79min, sitting tolerance ~20 min  .  Works Mon [employer], [place], Fri  Acupuncture treatment #18   Indication is lumbar postlaminectomy syndrome and chronic right lumbar radiculopathy.  Last treatment 10/14/2019, the patient had good pain relief for about 2 weeks but then needed to resume gabapentin 100 mg at night technique: Craig/PENS,in combination with n/n+1 treatment ,  acupuncture with electrical stimulation Indication radicular pain right lower extremity postlaminectomy    Trunk It is placed bilaterally at BL 23 and BL 26 , as well as right BL 24, BL 25, Needle placed at GB 30 right side  Right lower limb Right GB 31 right GB 34 and right SP 6 Electrical stimulation 2.5 Hz for 20 minutes  Pt then placed in supine and needles placed ST 36, ST 31 and KI 3 Estim ST31-36 for Patient tolerated procedure well  Repeat in 4 wks, hopefully will be able to extend time in between treatments, recommend 3 more visits

## 2020-01-20 ENCOUNTER — Encounter
Payer: No Typology Code available for payment source | Attending: Physical Medicine & Rehabilitation | Admitting: Physical Medicine & Rehabilitation

## 2020-01-20 ENCOUNTER — Encounter: Payer: Self-pay | Admitting: Physical Medicine & Rehabilitation

## 2020-01-20 ENCOUNTER — Other Ambulatory Visit: Payer: Self-pay

## 2020-01-20 VITALS — BP 127/81 | HR 81 | Ht 71.0 in | Wt 237.4 lb

## 2020-01-20 DIAGNOSIS — M961 Postlaminectomy syndrome, not elsewhere classified: Secondary | ICD-10-CM

## 2020-01-20 DIAGNOSIS — M5416 Radiculopathy, lumbar region: Secondary | ICD-10-CM | POA: Diagnosis not present

## 2020-01-20 NOTE — Progress Notes (Signed)
   last acupuncture visit 4 wks ago, standing /walking tolerance ~47min, sitting tolerance ~20 min  .  Works Mon [facility], [place], Fri  Acupuncture treatment #19   Indication is lumbar postlaminectomy syndrome and chronic right lumbar radiculopathy.  Last treatment 12/23/2019, the patient had good pain relief for about 2 weeks but then needed to resume gabapentin 100 mg at night technique: Craig/PENS,in combination with n/n+1 treatment ,  acupuncture with electrical stimulation Indication radicular pain right lower extremity postlaminectomy    Trunk It is placed bilaterally at BL 23 and BL 26 , as well as right BL 24, BL 25, Needle placed at GB 30 right side  Right lower limb Right GB 31 right GB 34 and right SP 6 Electrical stimulation 2.5 Hz for 20 minutes  Pt then placed in supine and needles placed ST 36, ST 31 and KI 3 Estim ST31-36 for Patient tolerated procedure well  Repeat in 4 wks, hopefully will be able to maintain adequate pain relief for 4wks between treatments, recommend 2 more visits

## 2020-01-20 NOTE — Patient Instructions (Signed)
Acupuncture Acupuncture is a type of treatment that involves stimulating specific points on your body by inserting thin needles through your skin. Acupuncture is often used to treat pain, but it may also be used to help relieve other types of symptoms. Your health care provider may recommend acupuncture to help treat various conditions, such as:  Migraine headaches.  Tension headaches.  Arthritis pain.  Addiction.  Chronic pain.  Nausea and vomiting after a surgery.  High blood pressure (hypertension).  Chronic obstructive pulmonary disease (COPD).  Nausea caused by cancer treatment.  Sudden or severe (acute) pain. Acupuncture is based on traditional Chinese medicine, which recognizes more than 2,000 points on the body that connect energy pathways (meridians) through the body. The goal in stimulating these points is to balance the physical, emotional, and mental energy in your body. Acupuncture is done by a health care provider who has specialized training (licensed acupuncture practitioner). Treatment often requires several acupuncture sessions. You may have acupuncture along with other medical treatments. Tell a health care provider about:  Any allergies you have.  All medicines you are taking, including vitamins, herbs, eye drops, creams, and over-the-counter medicines.  Any blood disorders you have.  Any surgeries you have had.  Any medical conditions you have.  Whether you are pregnant or may be pregnant. What are the risks? Generally, this is a safe treatment. However, problems may occur, including:  Skin infection.  Damage to organs or structures that are under the skin where a needle is placed. What happens before the treatment?  Your acupuncture practitioner will ask about your medical history and your symptoms.  You may have a physical exam. What happens during the treatment? The exact procedure will depend on your condition and how your acupuncture provider  treats it. In general:  Your skin will be cleaned with a germ-killing (antiseptic) solution.  Your acupuncture practitioner will open a new set of germ-free (sterile) needles.  The needles will be gently inserted into your skin. They will be left in place for a certain amount of time. You may feel slight pain or a tingling sensation.  Your acupuncture practitioner may: ? Apply electrical energy to the needles. ? Adjust the needles in certain ways.  After your procedure, the acupuncture practitioner will remove the needles, throw them away, and clean your skin. The procedure may vary among health care providers. What can I expect after the treatment? People react differently to acupuncture. Make sure you ask your acupuncture provider what to expect after your treatment. It is common to have:  Minor bruising.  Mild pain.  A small amount of bleeding. Follow these instructions at home:  Follow any instructions given by your provider after the treatment.  Keep all follow-up visits as told by your health care provider. This is important. Contact a health care provider if:  You have questions about your reaction to the treatment.  You have soreness.  You have skin irritation or redness.  You have a fever. Summary  Acupuncture is a type of treatment that involves stimulating specific points on your body by inserting thin needles through your skin.  This treatment is often used to treat pain, but it may also be used to help relieve other types of symptoms.  The exact procedure will depend on your condition and how your acupuncture provider treats it. This information is not intended to replace advice given to you by your health care provider. Make sure you discuss any questions you have with your health   care provider. Document Revised: 03/27/2017 Document Reviewed: 01/09/2017 Elsevier Patient Education  2020 [employer]  

## 2020-02-10 ENCOUNTER — Other Ambulatory Visit: Payer: Self-pay | Admitting: Family Medicine

## 2020-02-10 DIAGNOSIS — Z136 Encounter for screening for cardiovascular disorders: Secondary | ICD-10-CM

## 2020-02-16 ENCOUNTER — Encounter
Payer: No Typology Code available for payment source | Attending: Physical Medicine & Rehabilitation | Admitting: Physical Medicine & Rehabilitation

## 2020-02-16 ENCOUNTER — Other Ambulatory Visit: Payer: Self-pay

## 2020-02-16 ENCOUNTER — Encounter: Payer: Self-pay | Admitting: Physical Medicine & Rehabilitation

## 2020-02-16 DIAGNOSIS — M5416 Radiculopathy, lumbar region: Secondary | ICD-10-CM | POA: Diagnosis not present

## 2020-02-16 DIAGNOSIS — M961 Postlaminectomy syndrome, not elsewhere classified: Secondary | ICD-10-CM | POA: Diagnosis present

## 2020-02-16 NOTE — Progress Notes (Signed)
   last acupuncture visit 4 wks ago, did relatively well in the interval time Works Mon [facility], [place], Fri  Acupuncture treatment #20   Last treatment 01/20/2020  Trunk It is placed bilaterally at BL 23 and BL 26 , as well as right BL 24, BL 25, Needle placed at GB 30 right side  Right lower limb Right GB 31 right GB 34 and right SP 6 Electrical stimulation 2.5 Hz for 20 minutes  Pt then placed in supine and needles placed ST 36, ST 31 and KI 3 Estim ST31-36 for Patient tolerated procedure well  Repeat in 4 wks, hopefully will be able to maintain adequate pain relief for 4wks between treatments, recommend 1 more visit at 4-week interval then spread out to 6-week interval

## 2020-02-16 NOTE — Patient Instructions (Signed)
Acupuncture Acupuncture is a type of treatment that involves stimulating specific points on your body by inserting thin needles through your skin. Acupuncture is often used to treat pain, but it may also be used to help relieve other types of symptoms. Your health care provider may recommend acupuncture to help treat various conditions, such as:  Migraine headaches.  Tension headaches.  Arthritis pain.  Addiction.  Chronic pain.  Nausea and vomiting after a surgery.  High blood pressure (hypertension).  Chronic obstructive pulmonary disease (COPD).  Nausea caused by cancer treatment.  Sudden or severe (acute) pain. Acupuncture is based on traditional Chinese medicine, which recognizes more than 2,000 points on the body that connect energy pathways (meridians) through the body. The goal in stimulating these points is to balance the physical, emotional, and mental energy in your body. Acupuncture is done by a health care provider who has specialized training (licensed acupuncture practitioner). Treatment often requires several acupuncture sessions. You may have acupuncture along with other medical treatments. Tell a health care provider about:  Any allergies you have.  All medicines you are taking, including vitamins, herbs, eye drops, creams, and over-the-counter medicines.  Any blood disorders you have.  Any surgeries you have had.  Any medical conditions you have.  Whether you are pregnant or may be pregnant. What are the risks? Generally, this is a safe treatment. However, problems may occur, including:  Skin infection.  Damage to organs or structures that are under the skin where a needle is placed. What happens before the treatment?  Your acupuncture practitioner will ask about your medical history and your symptoms.  You may have a physical exam. What happens during the treatment? The exact procedure will depend on your condition and how your acupuncture provider  treats it. In general:  Your skin will be cleaned with a germ-killing (antiseptic) solution.  Your acupuncture practitioner will open a new set of germ-free (sterile) needles.  The needles will be gently inserted into your skin. They will be left in place for a certain amount of time. You may feel slight pain or a tingling sensation.  Your acupuncture practitioner may: ? Apply electrical energy to the needles. ? Adjust the needles in certain ways.  After your procedure, the acupuncture practitioner will remove the needles, throw them away, and clean your skin. The procedure may vary among health care providers. What can I expect after the treatment? People react differently to acupuncture. Make sure you ask your acupuncture provider what to expect after your treatment. It is common to have:  Minor bruising.  Mild pain.  A small amount of bleeding. Follow these instructions at home:  Follow any instructions given by your provider after the treatment.  Keep all follow-up visits as told by your health care provider. This is important. Contact a health care provider if:  You have questions about your reaction to the treatment.  You have soreness.  You have skin irritation or redness.  You have a fever. Summary  Acupuncture is a type of treatment that involves stimulating specific points on your body by inserting thin needles through your skin.  This treatment is often used to treat pain, but it may also be used to help relieve other types of symptoms.  The exact procedure will depend on your condition and how your acupuncture provider treats it. This information is not intended to replace advice given to you by your health care provider. Make sure you discuss any questions you have with your health   care provider. Document Revised: 03/27/2017 Document Reviewed: 01/09/2017 Elsevier Patient Education  2020 [employer]  

## 2020-02-22 ENCOUNTER — Ambulatory Visit
Admission: RE | Admit: 2020-02-22 | Discharge: 2020-02-22 | Disposition: A | Discharge status: Home / Self Care | Payer: BC Managed Care – PPO | Source: Ambulatory Visit | Attending: Family Medicine | Admitting: Family Medicine

## 2020-02-22 DIAGNOSIS — Z136 Encounter for screening for cardiovascular disorders: Secondary | ICD-10-CM

## 2020-03-09 IMAGING — CR DG SHOULDER 2+V*L*
3 series · 3 of 3 positions shown · non-contrast
Comparison: None.

CLINICAL DATA: 62-year-old male with chronic left shoulder pain for
years. No trauma. Initial encounter.

EXAM:
LEFT SHOULDER - 2+ VIEW

[w shoulder grashey left]
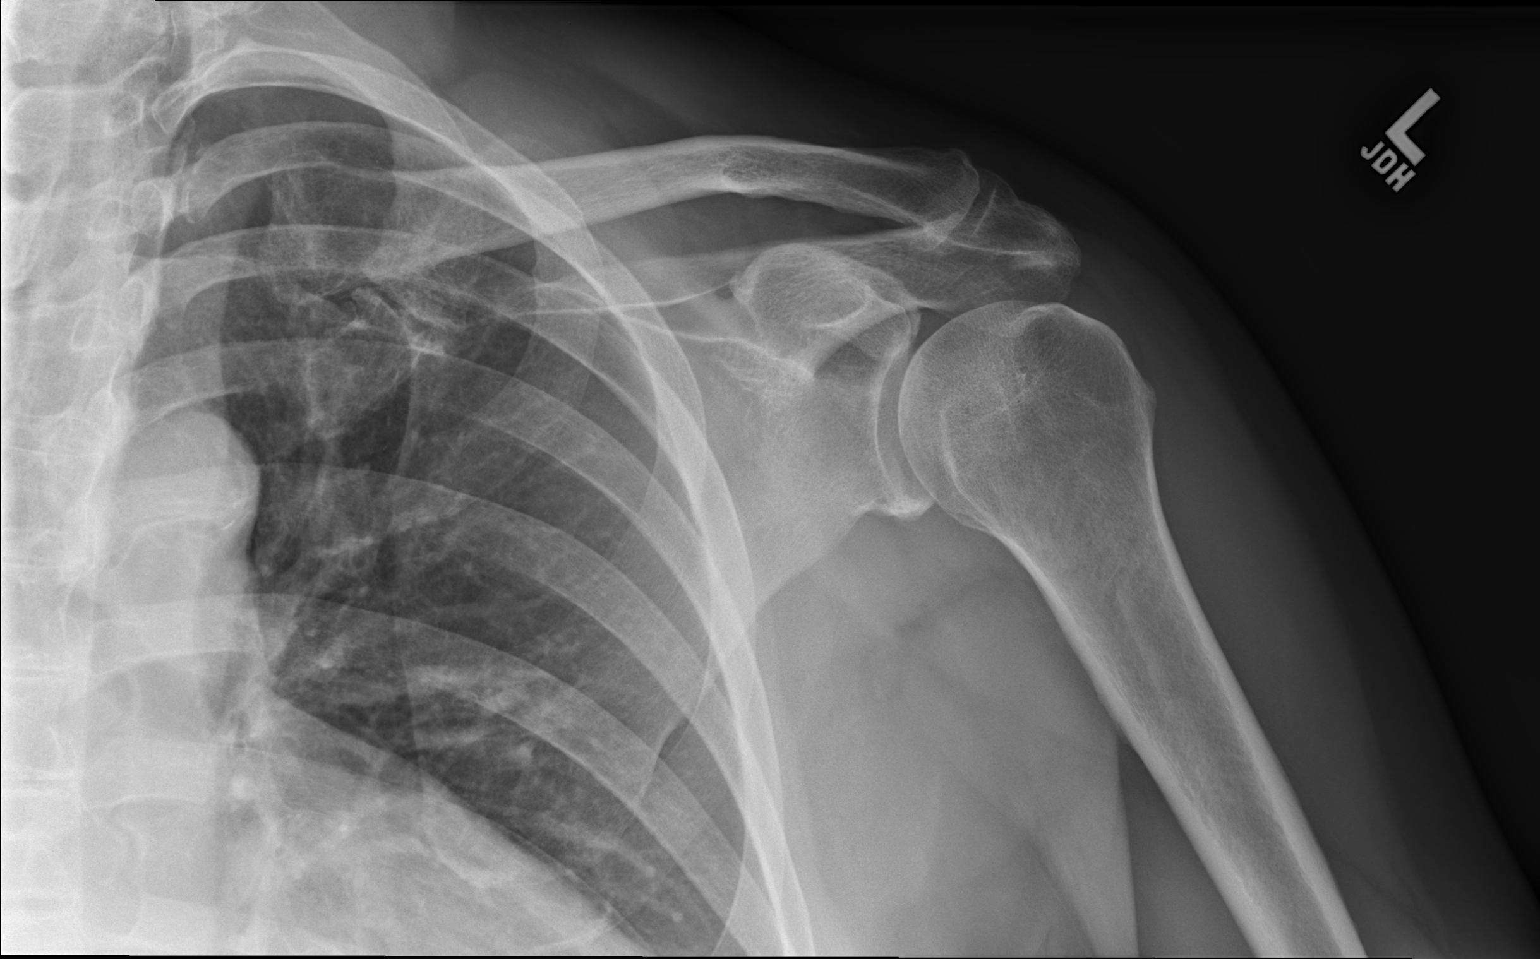

[w shoulder y-view left]
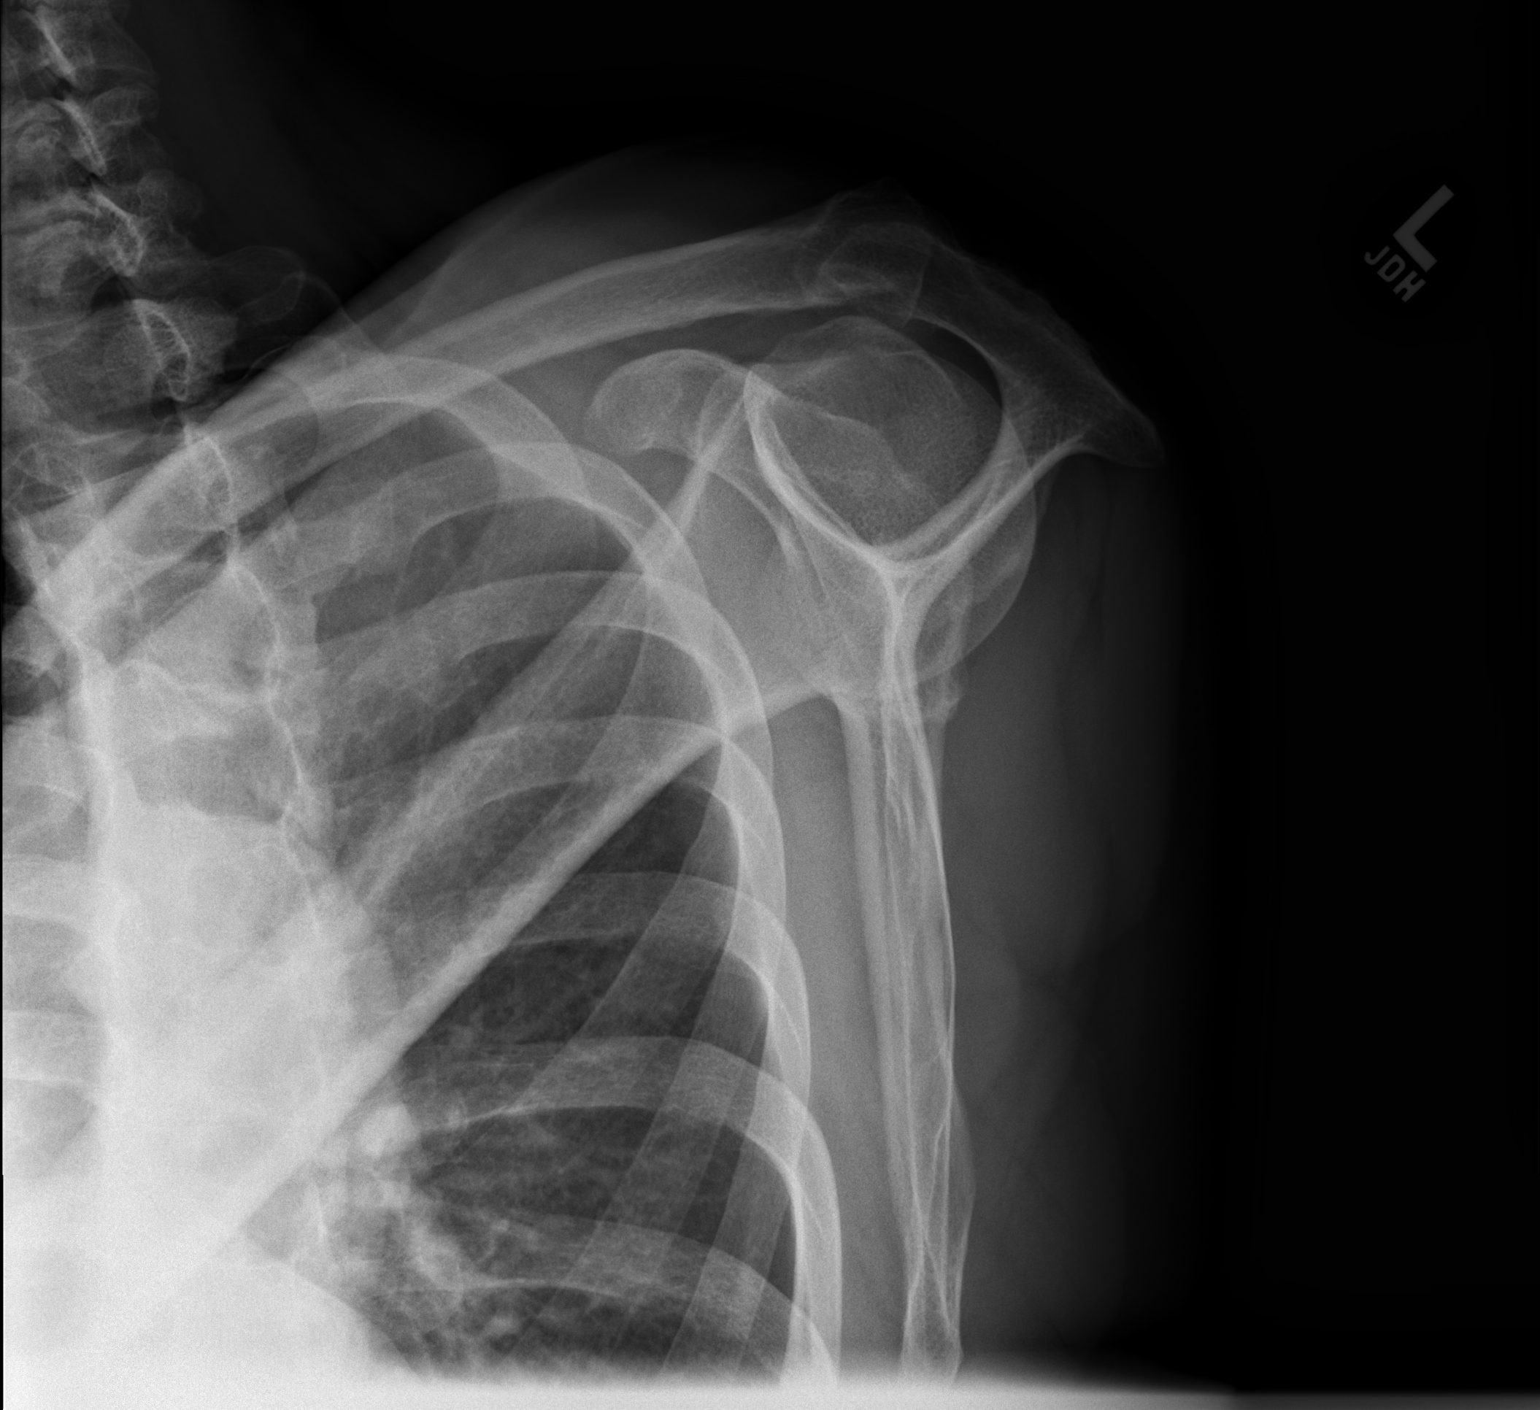

[w shoulder axillary left]
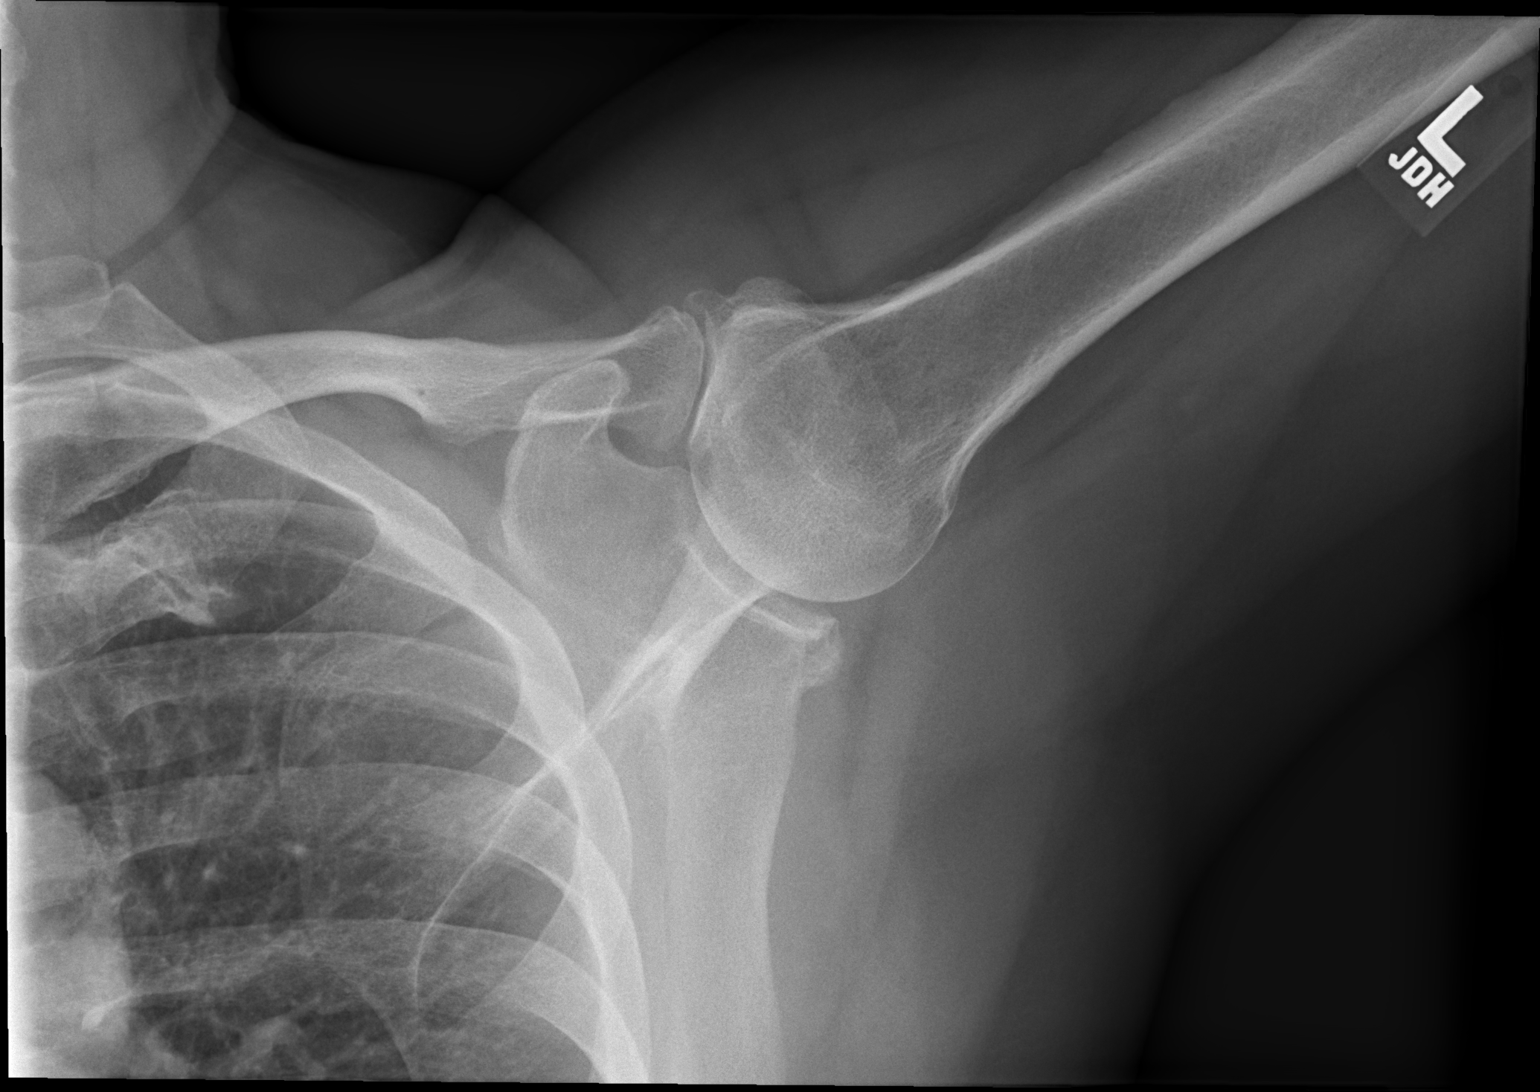

[3 of 3 positions shown; findings below may reference images not displayed]

FINDINGS: Mild to moderate acromioclavicular joint degenerative changes with
bony overgrowth. Mild degenerative changes inferior aspect of the
glenohumeral joint. No abnormal soft tissue calcifications. No
fracture or dislocation. Minimal left apical pleural thickening
without associated bony destruction. Calcified aortic knob.
IMPRESSION: 1. Mild to moderate acromioclavicular joint degenerative changes
with bony overgrowth.
2. Mild degenerative changes inferior aspect of the glenohumeral
joint.
3.  Aortic Atherosclerosis ([id number]).

## 2020-03-13 ENCOUNTER — Other Ambulatory Visit: Payer: Self-pay

## 2020-03-13 ENCOUNTER — Encounter: Payer: Self-pay | Admitting: Physical Medicine & Rehabilitation

## 2020-03-13 ENCOUNTER — Encounter
Payer: No Typology Code available for payment source | Attending: Physical Medicine & Rehabilitation | Admitting: Physical Medicine & Rehabilitation

## 2020-03-13 VITALS — BP 129/83 | HR 82 | Temp 97.8°F | Ht 71.0 in | Wt 235.0 lb

## 2020-03-13 DIAGNOSIS — M961 Postlaminectomy syndrome, not elsewhere classified: Secondary | ICD-10-CM | POA: Diagnosis present

## 2020-03-13 DIAGNOSIS — M5416 Radiculopathy, lumbar region: Secondary | ICD-10-CM | POA: Diagnosis not present

## 2020-03-13 NOTE — Progress Notes (Signed)
   last acupuncture visit 4 wks ago, did relatively well in the interval time Works Mon [facility], [place], Fri  Acupuncture treatment #21   Last treatment 02/16/2020  Trunk It is placed bilaterally at BL 23 and BL 26 , as well as right BL 24, BL 25, Needle placed at GB 30 right side  Right lower limb Right GB 31 right GB 34 and right SP 6 Electrical stimulation 2.5 Hz for 20 minutes  Pt then placed in supine and needles placed ST 36, ST 31 and KI 3 Estim ST31-36 for Patient tolerated procedure well  Increase to ~6-week interval

## 2020-03-13 NOTE — Patient Instructions (Signed)
Acupuncture Acupuncture is a type of treatment that involves stimulating specific points on your body by inserting thin needles through your skin. Acupuncture is often used to treat pain, but it may also be used to help relieve other types of symptoms. Your health care provider may recommend acupuncture to help treat various conditions, such as:  Migraine headaches.  Tension headaches.  Arthritis pain.  Addiction.  Chronic pain.  Nausea and vomiting after a surgery.  High blood pressure (hypertension).  Chronic obstructive pulmonary disease (COPD).  Nausea caused by cancer treatment.  Sudden or severe (acute) pain. Acupuncture is based on traditional Chinese medicine, which recognizes more than 2,000 points on the body that connect energy pathways (meridians) through the body. The goal in stimulating these points is to balance the physical, emotional, and mental energy in your body. Acupuncture is done by a health care provider who has specialized training (licensed acupuncture practitioner). Treatment often requires several acupuncture sessions. You may have acupuncture along with other medical treatments. Tell a health care provider about:  Any allergies you have.  All medicines you are taking, including vitamins, herbs, eye drops, creams, and over-the-counter medicines.  Any blood disorders you have.  Any surgeries you have had.  Any medical conditions you have.  Whether you are pregnant or may be pregnant. What are the risks? Generally, this is a safe treatment. However, problems may occur, including:  Skin infection.  Damage to organs or structures that are under the skin where a needle is placed. What happens before the treatment?  Your acupuncture practitioner will ask about your medical history and your symptoms.  You may have a physical exam. What happens during the treatment? The exact procedure will depend on your condition and how your acupuncture provider  treats it. In general:  Your skin will be cleaned with a germ-killing (antiseptic) solution.  Your acupuncture practitioner will open a new set of germ-free (sterile) needles.  The needles will be gently inserted into your skin. They will be left in place for a certain amount of time. You may feel slight pain or a tingling sensation.  Your acupuncture practitioner may: ? Apply electrical energy to the needles. ? Adjust the needles in certain ways.  After your procedure, the acupuncture practitioner will remove the needles, throw them away, and clean your skin. The procedure may vary among health care providers. What can I expect after the treatment? People react differently to acupuncture. Make sure you ask your acupuncture provider what to expect after your treatment. It is common to have:  Minor bruising.  Mild pain.  A small amount of bleeding. Follow these instructions at home:  Follow any instructions given by your provider after the treatment.  Keep all follow-up visits as told by your health care provider. This is important. Contact a health care provider if:  You have questions about your reaction to the treatment.  You have soreness.  You have skin irritation or redness.  You have a fever. Summary  Acupuncture is a type of treatment that involves stimulating specific points on your body by inserting thin needles through your skin.  This treatment is often used to treat pain, but it may also be used to help relieve other types of symptoms.  The exact procedure will depend on your condition and how your acupuncture provider treats it. This information is not intended to replace advice given to you by your health care provider. Make sure you discuss any questions you have with your health   care provider. Document Revised: 03/27/2017 Document Reviewed: 01/09/2017 Elsevier Patient Education  2020 [employer]  

## 2020-04-01 ENCOUNTER — Other Ambulatory Visit: Payer: Self-pay | Admitting: Physical Medicine & Rehabilitation

## 2020-05-01 ENCOUNTER — Encounter: Payer: Self-pay | Admitting: Physical Medicine & Rehabilitation

## 2020-05-01 ENCOUNTER — Encounter
Payer: No Typology Code available for payment source | Attending: Physical Medicine & Rehabilitation | Admitting: Physical Medicine & Rehabilitation

## 2020-05-01 ENCOUNTER — Other Ambulatory Visit: Payer: Self-pay

## 2020-05-01 VITALS — BP 133/83 | HR 84 | Temp 98.2°F | Ht 71.0 in | Wt 238.8 lb

## 2020-05-01 DIAGNOSIS — M5416 Radiculopathy, lumbar region: Secondary | ICD-10-CM | POA: Diagnosis not present

## 2020-05-01 DIAGNOSIS — M961 Postlaminectomy syndrome, not elsewhere classified: Secondary | ICD-10-CM | POA: Diagnosis present

## 2020-05-01 NOTE — Progress Notes (Signed)
   last acupuncture visit 6 wks ago, did relatively well until 2 weeks ago  Acupuncture treatment #22   Last treatment 03/13/2020  Trunk It is placed bilaterally at BL 23 and BL 26 , as well as right BL 24, BL 25, Needle placed at GB 30 right side  Right lower limb Right GB 31 right GB 34 and right SP 6 Electrical stimulation 2.5 Hz for 20 minutes  Pt then placed in supine and needles placed ST 36, ST 31 and KI 3 Estim ST31-36 for Patient tolerated procedure well  Was not able to increase treatment interval to 6 weeks.  The patient requires every 4-week acupuncture treatments to adequately control right lower extremity radicular pain.  We will trial TENS unit, EMS [facility] unit was ordered.  He will come to the office to get this, instruction will be per equipment representative Return to clinic in 4 weeks.  We will continue same acupuncture treatment which has been helpful in controlling his pain.

## 2020-05-01 NOTE — Patient Instructions (Signed)
Acupuncture Acupuncture is a type of treatment that involves stimulating specific points on your body by inserting thin needles through your skin. Acupuncture is often used to treat pain, but it may also be used to help relieve other types of symptoms. Your health care provider may recommend acupuncture to help treat various conditions, such as:  Migraine headaches.  Tension headaches.  Arthritis pain.  Addiction.  Chronic pain.  Nausea and vomiting after a surgery.  High blood pressure (hypertension).  Chronic obstructive pulmonary disease (COPD).  Nausea caused by cancer treatment.  Sudden or severe (acute) pain. Acupuncture is based on traditional Chinese medicine, which recognizes more than 2,000 points on the body that connect energy pathways (meridians) through the body. The goal in stimulating these points is to balance the physical, emotional, and mental energy in your body. Acupuncture is done by a health care provider who has specialized training (licensed acupuncture practitioner). Treatment often requires several acupuncture sessions. You may have acupuncture along with other medical treatments. Tell a health care provider about:  Any allergies you have.  All medicines you are taking, including vitamins, herbs, eye drops, creams, and over-the-counter medicines.  Any blood disorders you have.  Any surgeries you have had.  Any medical conditions you have.  Whether you are pregnant or may be pregnant. What are the risks? Generally, this is a safe treatment. However, problems may occur, including:  Skin infection.  Damage to organs or structures that are under the skin where a needle is placed. What happens before the treatment?  Your acupuncture practitioner will ask about your medical history and your symptoms.  You may have a physical exam. What happens during the treatment? The exact procedure will depend on your condition and how your acupuncture provider  treats it. In general:  Your skin will be cleaned with a germ-killing (antiseptic) solution.  Your acupuncture practitioner will open a new set of germ-free (sterile) needles.  The needles will be gently inserted into your skin. They will be left in place for a certain amount of time. You may feel slight pain or a tingling sensation.  Your acupuncture practitioner may: ? Apply electrical energy to the needles. ? Adjust the needles in certain ways.  After your procedure, the acupuncture practitioner will remove the needles, throw them away, and clean your skin. The procedure may vary among health care providers. What can I expect after the treatment? People react differently to acupuncture. Make sure you ask your acupuncture provider what to expect after your treatment. It is common to have:  Minor bruising.  Mild pain.  A small amount of bleeding. Follow these instructions at home:  Follow any instructions given by your provider after the treatment.  Keep all follow-up visits as told by your health care provider. This is important. Contact a health care provider if:  You have questions about your reaction to the treatment.  You have soreness.  You have skin irritation or redness.  You have a fever. Summary  Acupuncture is a type of treatment that involves stimulating specific points on your body by inserting thin needles through your skin.  This treatment is often used to treat pain, but it may also be used to help relieve other types of symptoms.  The exact procedure will depend on your condition and how your acupuncture provider treats it. This information is not intended to replace advice given to you by your health care provider. Make sure you discuss any questions you have with your health   care provider. Document Revised: 03/27/2017 Document Reviewed: 01/09/2017 Elsevier Patient Education  2020 [employer]  

## 2020-05-30 ENCOUNTER — Telehealth: Payer: Self-pay | Admitting: Physical Medicine & Rehabilitation

## 2020-05-30 NOTE — Telephone Encounter (Signed)
Contacted [facility] to see if they could reach out to patient and give instructions over the phone.  Left voicemail with patient to explain that I reached out.

## 2020-05-30 NOTE — Telephone Encounter (Signed)
Patient has received a TENS unit and hasn't used it yet.  He was told since Covid, that the representative wouldn't be able to come into office.  He would like to know what he can do to help him to be able to use it now.  Please advise.

## 2020-06-01 ENCOUNTER — Encounter: Payer: No Typology Code available for payment source | Admitting: Physical Medicine & Rehabilitation

## 2020-06-28 ENCOUNTER — Other Ambulatory Visit: Payer: Self-pay

## 2020-06-28 ENCOUNTER — Encounter: Payer: Self-pay | Admitting: Physical Medicine & Rehabilitation

## 2020-06-28 ENCOUNTER — Encounter
Payer: No Typology Code available for payment source | Attending: Physical Medicine & Rehabilitation | Admitting: Physical Medicine & Rehabilitation

## 2020-06-28 VITALS — BP 125/82 | HR 86 | Temp 97.8°F | Ht 71.0 in | Wt 237.0 lb

## 2020-06-28 DIAGNOSIS — M961 Postlaminectomy syndrome, not elsewhere classified: Secondary | ICD-10-CM | POA: Diagnosis present

## 2020-06-28 DIAGNOSIS — M5416 Radiculopathy, lumbar region: Secondary | ICD-10-CM | POA: Insufficient documentation

## 2020-06-28 NOTE — Progress Notes (Signed)
  Patient has been using flex IT transcutaneous electrical nerve stimulation unit using it 15 minutes 3 times a week.  We discussed increasing it to 20 or 25 minutes once or twice a day every other day.  Patient feels like it is helping him. The patient has several questions about lead placement. We discussed that lead should not be closer than 2 inches apart nor further than 12 inches apart. The patient has retired since last visit.  Acupuncture treatment #23   Last treatment 1/4//2021  Trunk It is placed bilaterally at BL 23 and BL 26 , as well as right BL 24, BL 25, Needle placed at GB 30 right side  Right lower limb Right GB 31 right GB 34 and right SP 6 Electrical stimulation 2.5 Hz for 20 minutes  Pt then placed in supine and needles placed ST 36, ST 31 and KI 3 Estim ST31-36 for Patient tolerated procedure well  Increase treatment interval to 3 months.  Continue transcutaneous electrical nerve stimulation unit, flex IT as above. If he does well after the next treatment may be able to stop treatments and observe pain levels and functional levels off acupuncture and only using flex IT TENS unit

## 2020-06-28 NOTE — Patient Instructions (Addendum)
Acupuncture Acupuncture is a type of treatment that involves stimulating specific points on your body by inserting thin needles through your skin. Acupuncture is often used to treat pain, but it may also be used to help relieve other types of symptoms. Your health care provider may recommend acupuncture to help treat various conditions, such as:  Migraine headaches.  Tension headaches.  Arthritis pain.  Addiction.  Chronic pain.  Nausea and vomiting after a surgery.  High blood pressure (hypertension).  Chronic obstructive pulmonary disease (COPD).  Nausea caused by cancer treatment.  Sudden or severe (acute) pain. Acupuncture is based on traditional [place] medicine, which recognizes more than 2,000 points on the body that connect energy pathways (meridians) through the body. The goal in stimulating these points is to balance the physical, emotional, and mental energy in your body. Acupuncture is done by a health care provider who has specialized training (licensed acupuncture practitioner). Treatment often requires several acupuncture sessions. You may have acupuncture along with other medical treatments. Tell a health care provider about:  Any allergies you have.  All medicines you are taking, including vitamins, herbs, eye drops, creams, and over-the-counter medicines.  Any blood disorders you have.  Any surgeries you have had.  Any medical conditions you have.  Whether you are pregnant or may be pregnant. What are the risks? Generally, this is a safe treatment. However, problems may occur, including:  Skin infection.  Damage to organs or structures that are under the skin where a needle is placed. What happens before the treatment?  Your acupuncture practitioner will ask about your medical history and your symptoms.  You may have a physical exam. What happens during the treatment? The exact procedure will depend on your condition and how your acupuncture provider  treats it. In general:  Your skin will be cleaned with a germ-killing (antiseptic) solution.  Your acupuncture practitioner will open a new set of germ-free (sterile) needles.  The needles will be gently inserted into your skin. They will be left in place for a certain amount of time. You may feel slight pain or a tingling sensation.  Your acupuncture practitioner may: ? Apply electrical energy to the needles. ? Adjust the needles in certain ways.  After your procedure, the acupuncture practitioner will remove the needles, throw them away, and clean your skin. The procedure may vary among health care providers. What can I expect after the treatment? People react differently to acupuncture. Make sure you ask your acupuncture provider what to expect after your treatment. It is common to have:  Minor bruising.  Mild pain.  A small amount of bleeding. Follow these instructions at home:  Follow any instructions given by your provider after the treatment.  Keep all follow-up visits as told by your health care provider. This is important. Contact a health care provider if:  You have questions about your reaction to the treatment.  You have soreness.  You have skin irritation or redness.  You have a fever. Summary  Acupuncture is a type of treatment that involves stimulating specific points on your body by inserting thin needles through your skin.  This treatment is often used to treat pain, but it may also be used to help relieve other types of symptoms.  The exact procedure will depend on your condition and how your acupuncture provider treats it. This information is not intended to replace advice given to you by your health care provider. Make sure you discuss any questions you have with your health  care provider. Document Revised: 03/27/2017 Document Reviewed: 01/09/2017 Elsevier Patient Education  2021 Elsevier Inc.   Use TENs Twice a day , every other day

## 2020-09-27 ENCOUNTER — Encounter: Payer: Self-pay | Admitting: Physical Medicine & Rehabilitation

## 2020-09-27 ENCOUNTER — Other Ambulatory Visit: Payer: Self-pay

## 2020-09-27 ENCOUNTER — Encounter
Payer: No Typology Code available for payment source | Attending: Physical Medicine & Rehabilitation | Admitting: Physical Medicine & Rehabilitation

## 2020-09-27 VITALS — BP 126/86 | HR 93 | Temp 98.2°F | Ht 71.0 in | Wt 235.6 lb

## 2020-09-27 DIAGNOSIS — M961 Postlaminectomy syndrome, not elsewhere classified: Secondary | ICD-10-CM | POA: Insufficient documentation

## 2020-09-27 DIAGNOSIS — M5416 Radiculopathy, lumbar region: Secondary | ICD-10-CM | POA: Insufficient documentation

## 2020-09-27 NOTE — Patient Instructions (Signed)
May increase gabapentin to 200mg  every night  Please do side stretch 20 sec x 3 each day x 1 week then do 30sec x 3 each day

## 2020-09-27 NOTE — Progress Notes (Signed)
  Last acupuncture visit 06/28/2020.  Did well for 2 months and was able to stop taking gabapentin.  Patient has been using flex IT transcutaneous electrical nerve stimulation unit using it 15 minutes 1-2 x per day over the last month due to increased pain  The patient has retired since last visit.     Trunk It is placed bilaterally at BL 23 and BL 26 , as well as right BL 24, BL 25, Needle placed at GB 30 right side  Right lower limb Right GB 31 right GB 34 and right SP 6 Electrical stimulation 2.5 Hz for 20 minutes  Pt then placed in supine and needles placed ST 36, ST 31 and KI 3 Estim ST31-36 for Patient tolerated procedure well  Change treatment interval to 6wks for next treatment with goal of extending to every 2 months

## 2020-09-28 ENCOUNTER — Encounter: Payer: No Typology Code available for payment source | Admitting: Physical Medicine & Rehabilitation

## 2020-11-06 ENCOUNTER — Ambulatory Visit: Payer: No Typology Code available for payment source | Admitting: Physical Medicine & Rehabilitation

## 2020-11-09 ENCOUNTER — Other Ambulatory Visit: Payer: Self-pay

## 2020-11-09 ENCOUNTER — Encounter
Payer: No Typology Code available for payment source | Attending: Physical Medicine & Rehabilitation | Admitting: Physical Medicine & Rehabilitation

## 2020-11-09 ENCOUNTER — Encounter: Payer: Self-pay | Admitting: Physical Medicine & Rehabilitation

## 2020-11-09 VITALS — BP 126/73 | HR 78 | Temp 97.9°F | Ht 71.0 in | Wt 237.2 lb

## 2020-11-09 DIAGNOSIS — M961 Postlaminectomy syndrome, not elsewhere classified: Secondary | ICD-10-CM | POA: Diagnosis not present

## 2020-11-09 DIAGNOSIS — M5416 Radiculopathy, lumbar region: Secondary | ICD-10-CM

## 2020-11-09 NOTE — Patient Instructions (Signed)
Acupuncture Acupuncture is a type of treatment that involves stimulating specific points on your body by inserting thin needles through your skin. Acupuncture is often used to treat pain, but it may also be used to help relieve other types of symptoms. Your health care provider may recommend acupuncture to help treat various conditions, such as: Migraine headaches. Tension headaches. Arthritis pain. Addiction. Chronic pain. Nausea and vomiting after a surgery. High blood pressure (hypertension). Chronic obstructive pulmonary disease (COPD). Nausea caused by cancer treatment. Sudden or severe (acute) pain. Acupuncture is based on traditional Chinese medicine, which recognizes more than 2,000 points on the body that connect energy pathways (meridians) through the body. The goal in stimulating these points is to balance the physical, emotional, and mental energy in your body. Acupuncture is done by a health care provider who has specialized training (licensed acupuncture practitioner). Treatment often requires several acupuncture sessions. You may have acupuncturealong with other medical treatments. Tell a health care provider about: Any allergies you have. All medicines you are taking, including vitamins, herbs, eye drops, creams, and over-the-counter medicines. Any blood disorders you have. Any surgeries you have had. Any medical conditions you have. Whether you are pregnant or may be pregnant. What are the risks? Generally, this is a safe treatment. However, problems may occur, including: Skin infection. Damage to organs or structures that are under the skin where a needle is placed. What happens before the treatment? Your acupuncture practitioner will ask about your medical history and your symptoms. You may have a physical exam. What happens during the treatment? The exact procedure will depend on your condition and how your acupuncture provider treats it. In general: Your skin will  be cleaned with a germ-killing (antiseptic) solution. Your acupuncture practitioner will open a new set of germ-free (sterile) needles. The needles will be gently inserted into your skin. They will be left in place for a certain amount of time. You may feel slight pain or a tingling sensation. Your acupuncture practitioner may: Apply electrical energy to the needles. Adjust the needles in certain ways. After your procedure, the acupuncture practitioner will remove the needles, throw them away, and clean your skin. The procedure may vary among health care providers. What can I expect after the treatment? People react differently to acupuncture. Make sure you ask your acupuncture provider what to expect after your treatment. It is common to have: Minor bruising. Mild pain. A small amount of bleeding. Follow these instructions at home: Follow any instructions given by your provider after the treatment. Keep all follow-up visits as told by your health care provider. This is important. Contact a health care provider if: You have questions about your reaction to the treatment. You have soreness. You have skin irritation or redness. You have a fever. Summary Acupuncture is a type of treatment that involves stimulating specific points on your body by inserting thin needles through your skin. This treatment is often used to treat pain, but it may also be used to help relieve other types of symptoms. The exact procedure will depend on your condition and how your acupuncture provider treats it. This information is not intended to replace advice given to you by your health care provider. Make sure you discuss any questions you have with your healthcare provider. Document Revised: 03/29/2020 Document Reviewed: 03/29/2020 Elsevier Patient Education  2022 Elsevier Inc.  

## 2020-11-09 NOTE — Progress Notes (Signed)
  Last acupuncture visit 09/27/2020.  Did well for 2 months and was able to stop taking gabapentin.  Patient has been using flex IT transcutaneous electrical nerve stimulation unit using it 15 minutes 1-2 x per day over the last month due to increased pain  The patient has retired since last visit.  Not taking gabapentin x 1 wk   Trunk It is placed bilaterally at BL 23 and BL 26 , as well as right BL 24, BL 25, Needle placed at GB 30 right side  Right lower limb Right GB 31 right GB 34 and right SP 6 Electrical stimulation 2.5 Hz for 20 minutes  Pt then placed in supine and needles placed ST 36, ST 31 and KI 3 Estim ST31-36 for Patient tolerated procedure well  Change treatment interval to 8wks for next treatment

## 2020-12-28 ENCOUNTER — Other Ambulatory Visit: Payer: Self-pay | Admitting: Physical Medicine & Rehabilitation

## 2021-01-10 ENCOUNTER — Encounter
Payer: No Typology Code available for payment source | Attending: Physical Medicine & Rehabilitation | Admitting: Physical Medicine & Rehabilitation

## 2021-01-10 ENCOUNTER — Encounter: Payer: Self-pay | Admitting: Physical Medicine & Rehabilitation

## 2021-01-10 ENCOUNTER — Other Ambulatory Visit: Payer: Self-pay

## 2021-01-10 VITALS — BP 132/85 | HR 90 | Temp 98.3°F | Ht 71.0 in | Wt 235.0 lb

## 2021-01-10 DIAGNOSIS — M961 Postlaminectomy syndrome, not elsewhere classified: Secondary | ICD-10-CM | POA: Diagnosis not present

## 2021-01-10 DIAGNOSIS — M5416 Radiculopathy, lumbar region: Secondary | ICD-10-CM | POA: Diagnosis not present

## 2021-01-10 NOTE — Progress Notes (Signed)
  Last acupuncture visit 11/09/2020.  Did well for 6 months and was able to stop taking gabapentin.  .  Now taking gabapentin x 2 wks   Needles placed in thoracolumbar paaspinal area at BL 23 and BL 26 , as well as right BL 24, BL 25, Needle placed at GB 30 right side  Right lower limb Right GB 31 right GB 34 and right SP 6 Electrical stimulation 2.5 Hz for 20 minutes  Pt then placed in supine and needles placed ST 36, ST 31 and KI 3 Estim ST31-36 for Patient tolerated procedure well  Continue treatment interval to 8wks for next treatment

## 2021-01-10 NOTE — Patient Instructions (Signed)
Acupuncture Acupuncture is a type of treatment that involves stimulating specific points on your body by inserting thin needles through your skin. Acupuncture is often used to treat pain, but it may also be used to help relieve other types of symptoms. Your health care provider may recommend acupuncture to help treat various conditions, such as: Migraine headaches. Tension headaches. Arthritis pain. Addiction. Chronic pain. Nausea and vomiting after a surgery. High blood pressure (hypertension). Chronic obstructive pulmonary disease (COPD). Nausea caused by cancer treatment. Sudden or severe (acute) pain. Acupuncture is based on traditional Chinese medicine, which recognizes more than 2,000 points on the body that connect energy pathways (meridians) through the body. The goal in stimulating these points is to balance the physical, emotional, and mental energy in your body. Acupuncture is done by a health care provider who has specialized training (licensed acupuncture practitioner). Treatment often requires several acupuncture sessions. You may have acupuncturealong with other medical treatments. Tell a health care provider about: Any allergies you have. All medicines you are taking, including vitamins, herbs, eye drops, creams, and over-the-counter medicines. Any blood disorders you have. Any surgeries you have had. Any medical conditions you have. Whether you are pregnant or may be pregnant. What are the risks? Generally, this is a safe treatment. However, problems may occur, including: Skin infection. Damage to organs or structures that are under the skin where a needle is placed. What happens before the treatment? Your acupuncture practitioner will ask about your medical history and your symptoms. You may have a physical exam. What happens during the treatment? The exact procedure will depend on your condition and how your acupuncture provider treats it. In general: Your skin will  be cleaned with a germ-killing (antiseptic) solution. Your acupuncture practitioner will open a new set of germ-free (sterile) needles. The needles will be gently inserted into your skin. They will be left in place for a certain amount of time. You may feel slight pain or a tingling sensation. Your acupuncture practitioner may: Apply electrical energy to the needles. Adjust the needles in certain ways. After your procedure, the acupuncture practitioner will remove the needles, throw them away, and clean your skin. The procedure may vary among health care providers. What can I expect after the treatment? People react differently to acupuncture. Make sure you ask your acupuncture provider what to expect after your treatment. It is common to have: Minor bruising. Mild pain. A small amount of bleeding. Follow these instructions at home: Follow any instructions given by your provider after the treatment. Keep all follow-up visits as told by your health care provider. This is important. Contact a health care provider if: You have questions about your reaction to the treatment. You have soreness. You have skin irritation or redness. You have a fever. Summary Acupuncture is a type of treatment that involves stimulating specific points on your body by inserting thin needles through your skin. This treatment is often used to treat pain, but it may also be used to help relieve other types of symptoms. The exact procedure will depend on your condition and how your acupuncture provider treats it. This information is not intended to replace advice given to you by your health care provider. Make sure you discuss any questions you have with your healthcare provider. Document Revised: 03/29/2020 Document Reviewed: 03/29/2020 Elsevier Patient Education  2022 Elsevier Inc.  

## 2021-02-26 ENCOUNTER — Other Ambulatory Visit (HOSPITAL_BASED_OUTPATIENT_CLINIC_OR_DEPARTMENT_OTHER): Payer: Self-pay | Admitting: Family Medicine

## 2021-02-26 DIAGNOSIS — E7800 Pure hypercholesterolemia, unspecified: Secondary | ICD-10-CM

## 2021-02-26 DIAGNOSIS — I719 Aortic aneurysm of unspecified site, without rupture: Secondary | ICD-10-CM

## 2021-02-26 HISTORY — DX: Aortic aneurysm of unspecified site, without rupture: I71.9

## 2021-03-07 ENCOUNTER — Ambulatory Visit (HOSPITAL_BASED_OUTPATIENT_CLINIC_OR_DEPARTMENT_OTHER)
Admission: RE | Admit: 2021-03-07 | Discharge: 2021-03-07 | Disposition: A | Discharge status: Home / Self Care | Payer: Self-pay | Source: Ambulatory Visit | Attending: Family Medicine | Admitting: Family Medicine

## 2021-03-07 ENCOUNTER — Other Ambulatory Visit: Payer: Self-pay

## 2021-03-07 ENCOUNTER — Encounter (HOSPITAL_BASED_OUTPATIENT_CLINIC_OR_DEPARTMENT_OTHER): Payer: Self-pay

## 2021-03-07 DIAGNOSIS — E7800 Pure hypercholesterolemia, unspecified: Secondary | ICD-10-CM | POA: Insufficient documentation

## 2021-03-12 ENCOUNTER — Encounter: Payer: Self-pay | Admitting: Physical Medicine & Rehabilitation

## 2021-03-12 ENCOUNTER — Encounter
Payer: No Typology Code available for payment source | Attending: Physical Medicine & Rehabilitation | Admitting: Physical Medicine & Rehabilitation

## 2021-03-12 ENCOUNTER — Other Ambulatory Visit: Payer: Self-pay

## 2021-03-12 VITALS — BP 137/84 | HR 79 | Temp 97.9°F | Ht 71.0 in | Wt 227.0 lb

## 2021-03-12 DIAGNOSIS — M25462 Effusion, left knee: Secondary | ICD-10-CM | POA: Diagnosis not present

## 2021-03-12 NOTE — Patient Instructions (Signed)
Acupuncture ?Acupuncture is a type of treatment that involves stimulating specific points on your body by inserting thin needles through your skin. Acupuncture is often used to treat pain, but it may also be used to help relieve other types of symptoms. Your health care provider may recommend acupuncture to help treat various conditions, such as: ?Migraine headaches. ?Tension headaches. ?Arthritis pain. ?Addiction. ?Chronic pain. ?Nausea and vomiting after a surgery. ?High blood pressure (hypertension). ?Chronic obstructive pulmonary disease (COPD). ?Nausea caused by cancer treatment. ?Sudden or severe (acute) pain. ?Acupuncture is based on traditional Chinese medicine, which recognizes more than 2,000 points on the body that connect energy pathways (meridians) through the body. The goal in stimulating these points is to balance the physical, emotional, and mental energy in your body. Acupuncture is done by a health care provider who has specialized training (licensed acupuncture practitioner). ?Treatment often requires several acupuncture sessions. You may have acupuncture along with other medical treatments. ?Tell a health care provider about: ?Any allergies you have. ?All medicines you are taking, including vitamins, herbs, eye drops, creams, and over-the-counter medicines. ?Any blood disorders you have. ?Any surgeries you have had. ?Any medical conditions you have. ?Whether you are pregnant or may be pregnant. ?What are the risks? ?Generally, this is a safe treatment. However, problems may occur, including: ?Skin infection. ?Damage to organs or structures that are under the skin where a needle is placed. ?What happens before the treatment? ?Your acupuncture practitioner will ask about your medical history and your symptoms. ?You may have a physical exam. ?What happens during the treatment? ?The exact procedure will depend on your condition and how your acupuncture provider treats it. In general: ?Your skin will  be cleaned with a germ-killing (antiseptic) solution. ?Your acupuncture practitioner will open a new set of germ-free (sterile) needles. ?The needles will be gently inserted into your skin. They will be left in place for a certain amount of time. You may feel slight pain or a tingling sensation. ?Your acupuncture practitioner may: ?Apply electrical energy to the needles. ?Adjust the needles in certain ways. ?After your procedure, the acupuncture practitioner will remove the needles, throw them away, and clean your skin. ?The procedure may vary among health care providers. ?What can I expect after the treatment? ?People react differently to acupuncture. Make sure you ask your acupuncture provider what to expect after your treatment. It is common to have: ?Minor bruising. ?Mild pain. ?A small amount of bleeding. ?Follow these instructions at home: ?Follow any instructions given by your provider after the treatment. ?Keep all follow-up visits as told by your health care provider. This is important. ?Contact a health care provider if: ?You have questions about your reaction to the treatment. ?You have soreness. ?You have skin irritation or redness. ?You have a fever. ?Summary ?Acupuncture is a type of treatment that involves stimulating specific points on your body by inserting thin needles through your skin. ?This treatment is often used to treat pain, but it may also be used to help relieve other types of symptoms. ?The exact procedure will depend on your condition and how your acupuncture provider treats it. ?This information is not intended to replace advice given to you by your health care provider. Make sure you discuss any questions you have with your health care provider. ?Document Revised: 10/04/2020 Document Reviewed: 03/29/2020 ?Elsevier Patient Education ? 2022 Elsevier Inc. ? ?

## 2021-03-12 NOTE — Progress Notes (Signed)
  Last acupuncture visit 01/10/2021.   able to stop taking gabapentin for ~1 wk  . Using TENS unit since spring    Needles placed in thoracolumbar paaspinal area at BL 23 and BL 26 , as well as right BL 24, BL 25, Needle placed at GB 30 right side  Right lower limb Right GB 31 right GB 34 and right SP 6 Electrical stimulation 2.5 Hz for 20 minutes  Pt then placed in supine and needles placed ST 36, ST 31 and KI 3 Estim ST31-36 for Patient tolerated procedure well  Continue treatment interval to 8wks for next treatment

## 2021-04-30 DIAGNOSIS — I7121 Aneurysm of the ascending aorta, without rupture: Secondary | ICD-10-CM | POA: Diagnosis not present

## 2021-04-30 DIAGNOSIS — I77819 Aortic ectasia, unspecified site: Secondary | ICD-10-CM | POA: Diagnosis not present

## 2021-04-30 DIAGNOSIS — I088 Other rheumatic multiple valve diseases: Secondary | ICD-10-CM | POA: Diagnosis not present

## 2021-05-14 ENCOUNTER — Other Ambulatory Visit: Payer: Self-pay

## 2021-05-14 ENCOUNTER — Encounter
Payer: No Typology Code available for payment source | Attending: Physical Medicine & Rehabilitation | Admitting: Physical Medicine & Rehabilitation

## 2021-05-14 ENCOUNTER — Encounter: Payer: Self-pay | Admitting: Physical Medicine & Rehabilitation

## 2021-05-14 VITALS — BP 122/79 | HR 75 | Temp 97.8°F | Ht 71.0 in | Wt 224.8 lb

## 2021-05-14 DIAGNOSIS — M5416 Radiculopathy, lumbar region: Secondary | ICD-10-CM

## 2021-05-14 DIAGNOSIS — M961 Postlaminectomy syndrome, not elsewhere classified: Secondary | ICD-10-CM

## 2021-05-14 NOTE — Progress Notes (Signed)
°  Last acupuncture visit 03/12/2021.   Using TENS unit since spring    Needles placed in thoracolumbar paaspinal area at BL 23 and BL 26 , as well as right BL 24, BL 25, Needle placed at GB 30 right side  Right lower limb Right GB 31 right GB 34 and right SP 6 Electrical stimulation 2.5 Hz for 20 minutes  Pt then placed in supine and needles placed ST 36, ST 31 and KI 3 Estim ST31-36 for 33min Patient tolerated procedure well  Continue treatment interval to 8wks for next treatment

## 2021-05-24 DIAGNOSIS — H04123 Dry eye syndrome of bilateral lacrimal glands: Secondary | ICD-10-CM | POA: Diagnosis not present

## 2021-06-12 DIAGNOSIS — E7800 Pure hypercholesterolemia, unspecified: Secondary | ICD-10-CM | POA: Diagnosis not present

## 2021-06-30 ENCOUNTER — Other Ambulatory Visit: Payer: Self-pay | Admitting: Physical Medicine & Rehabilitation

## 2021-07-10 DIAGNOSIS — I7121 Aneurysm of the ascending aorta, without rupture: Secondary | ICD-10-CM | POA: Diagnosis not present

## 2021-07-12 ENCOUNTER — Encounter
Payer: No Typology Code available for payment source | Attending: Physical Medicine & Rehabilitation | Admitting: Physical Medicine & Rehabilitation

## 2021-07-12 ENCOUNTER — Other Ambulatory Visit: Payer: Self-pay

## 2021-07-12 ENCOUNTER — Encounter: Payer: Self-pay | Admitting: Physical Medicine & Rehabilitation

## 2021-07-12 VITALS — BP 127/81 | HR 77 | Temp 97.8°F | Ht 71.0 in | Wt 223.0 lb

## 2021-07-12 DIAGNOSIS — M5416 Radiculopathy, lumbar region: Secondary | ICD-10-CM | POA: Diagnosis present

## 2021-07-12 DIAGNOSIS — M961 Postlaminectomy syndrome, not elsewhere classified: Secondary | ICD-10-CM | POA: Insufficient documentation

## 2021-07-12 NOTE — Progress Notes (Signed)
Last acupuncture visit 05/14/2021.  Using gabapentin 100 mg nightly as needed.  This is mainly for the week or 2 prior to the next acupuncture treatment. ? ? ? ?Needles placed in thoracolumbar paaspinal area at BL 23 and BL 26 , as well as right BL 24, BL 25, ?Needle placed at GB 30 right side ? ?Right lower limb ?Right GB 31 right GB 34 and right SP 6 ?Electrical stimulation 2.5 Hz for 20 minutes ? ?Pt then placed in supine and needles placed ST 36, ST 31 and KI 3 ?Estim ST31-36 for 27min ?Patient tolerated procedure well ? ?Continue treatment interval to 8wks for next treatment  ?

## 2021-07-12 NOTE — Patient Instructions (Signed)
Acupuncture ?Acupuncture is a type of treatment that involves stimulating specific points on your body by inserting thin needles through your skin. Acupuncture is often used to treat pain, but it may also be used to help relieve other types of symptoms. Your health care provider may recommend acupuncture to help treat various conditions, such as: ?Migraine headaches. ?Tension headaches. ?Arthritis pain. ?Addiction. ?Chronic pain. ?Nausea and vomiting after a surgery. ?High blood pressure (hypertension). ?Chronic obstructive pulmonary disease (COPD). ?Nausea caused by cancer treatment. ?Sudden or severe (acute) pain. ?Acupuncture is based on traditional Chinese medicine, which recognizes more than 2,000 points on the body that connect energy pathways (meridians) through the body. The goal in stimulating these points is to balance the physical, emotional, and mental energy in your body. Acupuncture is done by a health care provider who has specialized training (licensed acupuncture practitioner). ?Treatment often requires several acupuncture sessions. You may have acupuncture along with other medical treatments. ?Tell a health care provider about: ?Any allergies you have. ?All medicines you are taking, including vitamins, herbs, eye drops, creams, and over-the-counter medicines. ?Any blood disorders you have. ?Any surgeries you have had. ?Any medical conditions you have. ?Whether you are pregnant or may be pregnant. ?What are the risks? ?Generally, this is a safe treatment. However, problems may occur, including: ?Skin infection. ?Damage to organs or structures that are under the skin where a needle is placed. ?What happens before the treatment? ?Your acupuncture practitioner will ask about your medical history and your symptoms. ?You may have a physical exam. ?What happens during the treatment? ?The exact procedure will depend on your condition and how your acupuncture provider treats it. In general: ?Your skin will  be cleaned with a germ-killing (antiseptic) solution. ?Your acupuncture practitioner will open a new set of germ-free (sterile) needles. ?The needles will be gently inserted into your skin. They will be left in place for a certain amount of time. You may feel slight pain or a tingling sensation. ?Your acupuncture practitioner may: ?Apply electrical energy to the needles. ?Adjust the needles in certain ways. ?After your procedure, the acupuncture practitioner will remove the needles, throw them away, and clean your skin. ?The procedure may vary among health care providers. ?What can I expect after the treatment? ?People react differently to acupuncture. Make sure you ask your acupuncture provider what to expect after your treatment. It is common to have: ?Minor bruising. ?Mild pain. ?A small amount of bleeding. ?Follow these instructions at home: ?Follow any instructions given by your provider after the treatment. ?Keep all follow-up visits as told by your health care provider. This is important. ?Contact a health care provider if: ?You have questions about your reaction to the treatment. ?You have soreness. ?You have skin irritation or redness. ?You have a fever. ?Summary ?Acupuncture is a type of treatment that involves stimulating specific points on your body by inserting thin needles through your skin. ?This treatment is often used to treat pain, but it may also be used to help relieve other types of symptoms. ?The exact procedure will depend on your condition and how your acupuncture provider treats it. ?This information is not intended to replace advice given to you by your health care provider. Make sure you discuss any questions you have with your health care provider. ?Document Revised: 10/04/2020 Document Reviewed: 03/29/2020 ?Elsevier Patient Education ? 2022 Elsevier Inc. ? ?

## 2021-07-31 DIAGNOSIS — D125 Benign neoplasm of sigmoid colon: Secondary | ICD-10-CM | POA: Diagnosis not present

## 2021-07-31 DIAGNOSIS — K5730 Diverticulosis of large intestine without perforation or abscess without bleeding: Secondary | ICD-10-CM | POA: Diagnosis not present

## 2021-07-31 DIAGNOSIS — K649 Unspecified hemorrhoids: Secondary | ICD-10-CM | POA: Diagnosis not present

## 2021-07-31 DIAGNOSIS — Z86010 Personal history of colonic polyps: Secondary | ICD-10-CM | POA: Diagnosis not present

## 2021-08-06 DIAGNOSIS — D125 Benign neoplasm of sigmoid colon: Secondary | ICD-10-CM | POA: Diagnosis not present

## 2021-09-13 ENCOUNTER — Encounter
Payer: No Typology Code available for payment source | Attending: Physical Medicine & Rehabilitation | Admitting: Physical Medicine & Rehabilitation

## 2021-09-13 ENCOUNTER — Encounter: Payer: Self-pay | Admitting: Physical Medicine & Rehabilitation

## 2021-09-13 VITALS — BP 147/88 | HR 70 | Ht 71.0 in | Wt 215.8 lb

## 2021-09-13 DIAGNOSIS — M961 Postlaminectomy syndrome, not elsewhere classified: Secondary | ICD-10-CM | POA: Insufficient documentation

## 2021-09-13 DIAGNOSIS — M5416 Radiculopathy, lumbar region: Secondary | ICD-10-CM | POA: Insufficient documentation

## 2021-09-13 NOTE — Progress Notes (Signed)
Last acupuncture visit 07/12/2021.  Using gabapentin 100 mg nightly as needed.Has not taken this for ~3-4 wks  This is mainly for the week or 2 prior to the next acupuncture treatment.    Needles placed in thoracolumbar paaspinal area at BL 23 and BL 26 , as well as right BL 24, BL 25, Needle placed at GB 30 right side  Right lower limb Right GB 31 right GB 34 and right SP 6 Electrical stimulation 2.5 Hz for 20 minutes  Pt then placed in supine and needles placed ST 36, ST 31 and KI 3 Estim ST31-36 for Patient tolerated procedure well  Continue treatment interval to 8wks for next treatment

## 2021-11-12 ENCOUNTER — Encounter
Payer: No Typology Code available for payment source | Attending: Physical Medicine & Rehabilitation | Admitting: Physical Medicine & Rehabilitation

## 2021-11-12 ENCOUNTER — Encounter: Payer: Self-pay | Admitting: Physical Medicine & Rehabilitation

## 2021-11-12 VITALS — BP 121/81 | HR 77 | Temp 98.3°F | Ht 71.0 in | Wt 210.0 lb

## 2021-11-12 DIAGNOSIS — M961 Postlaminectomy syndrome, not elsewhere classified: Secondary | ICD-10-CM

## 2021-11-12 DIAGNOSIS — M5416 Radiculopathy, lumbar region: Secondary | ICD-10-CM

## 2021-11-12 NOTE — Progress Notes (Signed)
Last acupuncture visit 09/13/2021.  Using gabapentin 100 mg nightly   This is mainly for the week or 2 prior to the next acupuncture treatment.    Needles placed in thoracolumbar paaspinal area at BL 23 and BL 26 , as well as right BL 24, BL 25, Needle placed at GB 30 right side  Right lower limb Right GB 31 right GB 34 and right SP 6 Electrical stimulation 2.5 Hz for 20 minutes  Pt then placed in supine and needles placed ST 36, ST 31 and KI 3 Estim ST31-36 for 20min Patient tolerated procedure well  Continue treatment interval to 8wks for next treatment  

## 2021-12-17 DIAGNOSIS — H524 Presbyopia: Secondary | ICD-10-CM | POA: Diagnosis not present

## 2021-12-17 DIAGNOSIS — H52203 Unspecified astigmatism, bilateral: Secondary | ICD-10-CM | POA: Diagnosis not present

## 2021-12-17 DIAGNOSIS — H04123 Dry eye syndrome of bilateral lacrimal glands: Secondary | ICD-10-CM | POA: Diagnosis not present

## 2021-12-17 DIAGNOSIS — H5203 Hypermetropia, bilateral: Secondary | ICD-10-CM | POA: Diagnosis not present

## 2021-12-17 DIAGNOSIS — H43813 Vitreous degeneration, bilateral: Secondary | ICD-10-CM | POA: Diagnosis not present

## 2022-01-14 ENCOUNTER — Encounter
Payer: No Typology Code available for payment source | Attending: Physical Medicine & Rehabilitation | Admitting: Physical Medicine & Rehabilitation

## 2022-01-14 ENCOUNTER — Encounter: Payer: Self-pay | Admitting: Physical Medicine & Rehabilitation

## 2022-01-14 VITALS — BP 124/82 | HR 64 | Ht 71.0 in | Wt 207.0 lb

## 2022-01-14 DIAGNOSIS — M5416 Radiculopathy, lumbar region: Secondary | ICD-10-CM | POA: Diagnosis present

## 2022-01-14 DIAGNOSIS — M961 Postlaminectomy syndrome, not elsewhere classified: Secondary | ICD-10-CM | POA: Diagnosis present

## 2022-01-14 NOTE — Patient Instructions (Signed)
Acupuncture Acupuncture is a type of treatment that involves stimulating specific points on your body by inserting thin needles through your skin. Acupuncture is often used to treat pain, but it may also be used to help relieve other types of symptoms. Your health care provider may recommend acupuncture to help treat various conditions, such as: Migraine and tension headaches. Nausea and vomiting after a surgery or cancer treatment. Sudden or severe (acute) pain, or long-term (chronic) pain. Addiction. Acupuncture is based on traditional Chinese medicine, which recognizes more than 2,000 points on the body that connect energy pathways (meridians) through the body. The goal in stimulating these points is to balance the physical, emotional, and mental energy in your body. Acupuncture is done by a health care provider who has specialized training (licensed acupuncture practitioner). Treatment often requires several acupuncture sessions. You may have acupuncture along with other medical treatments. Tell a health care provider about: Any allergies you have. All medicines you are taking, including vitamins, herbs, eye drops, creams, and over-the-counter medicines. Any blood disorders you have. Any surgeries you have had. Any medical conditions you have. Whether you are pregnant or may be pregnant. What are the risks? Generally, this is a safe treatment. However, problems may occur, including: Skin infection. Damage to organs or structures that are under the skin if a needle is placed too deeply. This is rare. What happens before the treatment? Your acupuncture practitioner will ask about your medical history and your symptoms. You may have a physical exam. What happens during the treatment? The exact procedure will depend on your condition and how your acupuncture provider treats it. In general: Your skin will be cleaned with a germ-killing (antiseptic) solution. Your acupuncture practitioner will  open a new set of germ-free (sterile) needles. The needles will be gently inserted into your skin. They will be left in place for a certain amount of time. You may feel a tingling or burning sensation for a very short period of time. Your acupuncture practitioner may: Apply electrical energy to the needles. Adjust the needles in certain ways. After your procedure, the acupuncture practitioner will remove the needles, throw them away, and clean your skin. The procedure may vary among health care providers. What can I expect after the treatment? People react differently to acupuncture. Make sure you ask your acupuncture provider what to expect after your treatment. It is common to have: Minor bruising. Mild pain. A small amount of bleeding. Follow these instructions at home: Follow any instructions given by your provider after the treatment. Keep all follow-up visits. This is important. Where to find more information [facility]: www.nccih.nih.gov Contact a health care provider if: You have questions about your reaction to the treatment. You have soreness. You have skin irritation or redness. You have a fever. Summary Acupuncture is a type of treatment that involves stimulating specific points on your body by inserting thin needles through your skin. This treatment is often used to treat pain, but it may also be used to help relieve other types of symptoms. The exact procedure will depend on your condition and how your acupuncture provider treats it. This information is not intended to replace advice given to you by your health care provider. Make sure you discuss any questions you have with your health care provider. Document Revised: 12/19/2020 Document Reviewed: 12/19/2020 Elsevier Patient Education  2023 Elsevier Inc.  

## 2022-01-14 NOTE — Progress Notes (Signed)
Last acupuncture visit 09/13/2021.  Using gabapentin 100 mg nightly   This is mainly for the week or 2 prior to the next acupuncture treatment.    Needles placed in thoracolumbar paaspinal area at BL 23 and BL 26 , as well as right BL 24, BL 25, Needle placed at GB 30 right side  Right lower limb Right GB 31 right GB 34 and right SP 6 Electrical stimulation 2.5 Hz for 20 minutes  Pt then placed in supine and needles placed ST 36, ST 31 and KI 3 Estim ST31-36 for 26min Patient tolerated procedure well  Continue treatment interval to 8wks for next treatment

## 2022-03-06 DIAGNOSIS — Z0000 Encounter for general adult medical examination without abnormal findings: Secondary | ICD-10-CM | POA: Diagnosis not present

## 2022-03-06 DIAGNOSIS — Z79899 Other long term (current) drug therapy: Secondary | ICD-10-CM | POA: Diagnosis not present

## 2022-03-06 DIAGNOSIS — Z125 Encounter for screening for malignant neoplasm of prostate: Secondary | ICD-10-CM | POA: Diagnosis not present

## 2022-03-06 DIAGNOSIS — E7800 Pure hypercholesterolemia, unspecified: Secondary | ICD-10-CM | POA: Diagnosis not present

## 2022-03-11 DIAGNOSIS — Z0000 Encounter for general adult medical examination without abnormal findings: Secondary | ICD-10-CM | POA: Diagnosis not present

## 2022-03-11 DIAGNOSIS — N281 Cyst of kidney, acquired: Secondary | ICD-10-CM | POA: Diagnosis not present

## 2022-03-11 DIAGNOSIS — I700 Atherosclerosis of aorta: Secondary | ICD-10-CM | POA: Diagnosis not present

## 2022-03-11 DIAGNOSIS — I10 Essential (primary) hypertension: Secondary | ICD-10-CM | POA: Diagnosis not present

## 2022-03-11 DIAGNOSIS — N183 Chronic kidney disease, stage 3 unspecified: Secondary | ICD-10-CM | POA: Diagnosis not present

## 2022-03-11 DIAGNOSIS — M5416 Radiculopathy, lumbar region: Secondary | ICD-10-CM | POA: Diagnosis not present

## 2022-03-11 DIAGNOSIS — Z79899 Other long term (current) drug therapy: Secondary | ICD-10-CM | POA: Diagnosis not present

## 2022-03-11 DIAGNOSIS — Z23 Encounter for immunization: Secondary | ICD-10-CM | POA: Diagnosis not present

## 2022-03-11 DIAGNOSIS — E7800 Pure hypercholesterolemia, unspecified: Secondary | ICD-10-CM | POA: Diagnosis not present

## 2022-03-13 ENCOUNTER — Encounter: Payer: Self-pay | Admitting: Physical Medicine & Rehabilitation

## 2022-03-13 ENCOUNTER — Encounter
Payer: No Typology Code available for payment source | Attending: Physical Medicine & Rehabilitation | Admitting: Physical Medicine & Rehabilitation

## 2022-03-13 ENCOUNTER — Other Ambulatory Visit: Payer: Self-pay | Admitting: Physical Medicine & Rehabilitation

## 2022-03-13 VITALS — BP 122/79 | HR 76 | Temp 98.1°F | Ht 71.0 in | Wt 203.0 lb

## 2022-03-13 DIAGNOSIS — M5416 Radiculopathy, lumbar region: Secondary | ICD-10-CM

## 2022-03-13 DIAGNOSIS — M961 Postlaminectomy syndrome, not elsewhere classified: Secondary | ICD-10-CM | POA: Diagnosis not present

## 2022-03-13 MED ORDER — GABAPENTIN 50 MG PO TABS: 50.0000 mg | ORAL_TABLET | Freq: Every evening | ORAL | 1 refills | Status: DC | PRN

## 2022-03-13 NOTE — Patient Instructions (Signed)
Acupuncture Acupuncture is a type of treatment that involves stimulating specific points on your body by inserting thin needles through your skin. Acupuncture is often used to treat pain, but it may also be used to help relieve other types of symptoms. Your health care provider may recommend acupuncture to help treat various conditions, such as: Migraine and tension headaches. Nausea and vomiting after a surgery or cancer treatment. Sudden or severe (acute) pain, or long-term (chronic) pain. Addiction. Acupuncture is based on traditional Chinese medicine, which recognizes more than 2,000 points on the body that connect energy pathways (meridians) through the body. The goal in stimulating these points is to balance the physical, emotional, and mental energy in your body. Acupuncture is done by a health care provider who has specialized training (licensed acupuncture practitioner). Treatment often requires several acupuncture sessions. You may have acupuncture along with other medical treatments. Tell a health care provider about: Any allergies you have. All medicines you are taking, including vitamins, herbs, eye drops, creams, and over-the-counter medicines. Any blood disorders you have. Any surgeries you have had. Any medical conditions you have. Whether you are pregnant or may be pregnant. What are the risks? Generally, this is a safe treatment. However, problems may occur, including: Skin infection. Damage to organs or structures that are under the skin if a needle is placed too deeply. This is rare. What happens before the treatment? Your acupuncture practitioner will ask about your medical history and your symptoms. You may have a physical exam. What happens during the treatment? The exact procedure will depend on your condition and how your acupuncture provider treats it. In general: Your skin will be cleaned with a germ-killing (antiseptic) solution. Your acupuncture practitioner will  open a new set of germ-free (sterile) needles. The needles will be gently inserted into your skin. They will be left in place for a certain amount of time. You may feel a tingling or burning sensation for a very short period of time. Your acupuncture practitioner may: Apply electrical energy to the needles. Adjust the needles in certain ways. After your procedure, the acupuncture practitioner will remove the needles, throw them away, and clean your skin. The procedure may vary among health care providers. What can I expect after the treatment? People react differently to acupuncture. Make sure you ask your acupuncture provider what to expect after your treatment. It is common to have: Minor bruising. Mild pain. A small amount of bleeding. Follow these instructions at home: Follow any instructions given by your provider after the treatment. Keep all follow-up visits. This is important. Where to find more information [facility]: www.nccih.nih.gov Contact a health care provider if: You have questions about your reaction to the treatment. You have soreness. You have skin irritation or redness. You have a fever. Summary Acupuncture is a type of treatment that involves stimulating specific points on your body by inserting thin needles through your skin. This treatment is often used to treat pain, but it may also be used to help relieve other types of symptoms. The exact procedure will depend on your condition and how your acupuncture provider treats it. This information is not intended to replace advice given to you by your health care provider. Make sure you discuss any questions you have with your health care provider. Document Revised: 12/19/2020 Document Reviewed: 12/19/2020 Elsevier Patient Education  2023 Elsevier Inc.  

## 2022-03-13 NOTE — Progress Notes (Signed)
Last acupuncture visit 01/14/2022.  Using gabapentin 100 mg nightly   This is mainly for the week prior to the next acupuncture treatment. Has hangover effect even when pt takes the medication at 7pm    Needles placed in thoracolumbar paaspinal area at BL 23 and BL 26 , as well as right BL 24, BL 25, Needle placed at GB 30 right side  Right lower limb Right GB 31 right GB 34 and right KI 3 Electrical stimulation 2.5 Hz for 20 minutes  Pt then placed in supine and needles placed ST 36, ST 31 and SP 6 Estim ST31-36 for Patient tolerated procedure well  Continue treatment interval to 8wks for next treatment   Change Gabapentin to 50mg  qhs

## 2022-03-25 DIAGNOSIS — H8110 Benign paroxysmal vertigo, unspecified ear: Secondary | ICD-10-CM | POA: Diagnosis not present

## 2022-03-25 DIAGNOSIS — H9319 Tinnitus, unspecified ear: Secondary | ICD-10-CM | POA: Diagnosis not present

## 2022-04-23 DIAGNOSIS — R52 Pain, unspecified: Secondary | ICD-10-CM | POA: Diagnosis not present

## 2022-04-23 DIAGNOSIS — R059 Cough, unspecified: Secondary | ICD-10-CM | POA: Diagnosis not present

## 2022-04-23 DIAGNOSIS — R0789 Other chest pain: Secondary | ICD-10-CM | POA: Diagnosis not present

## 2022-04-23 DIAGNOSIS — R509 Fever, unspecified: Secondary | ICD-10-CM | POA: Diagnosis not present

## 2022-04-23 DIAGNOSIS — R0981 Nasal congestion: Secondary | ICD-10-CM | POA: Diagnosis not present

## 2022-04-23 DIAGNOSIS — J101 Influenza due to other identified influenza virus with other respiratory manifestations: Secondary | ICD-10-CM | POA: Diagnosis not present

## 2022-04-23 DIAGNOSIS — Z03818 Encounter for observation for suspected exposure to other biological agents ruled out: Secondary | ICD-10-CM | POA: Diagnosis not present

## 2022-05-08 ENCOUNTER — Ambulatory Visit: Payer: Self-pay | Admitting: Physical Medicine & Rehabilitation

## 2022-05-20 ENCOUNTER — Encounter: Payer: Self-pay | Admitting: Physical Medicine & Rehabilitation

## 2022-05-20 ENCOUNTER — Encounter
Payer: No Typology Code available for payment source | Attending: Physical Medicine & Rehabilitation | Admitting: Physical Medicine & Rehabilitation

## 2022-05-20 VITALS — BP 134/80 | HR 73 | Temp 98.3°F | Ht 71.0 in | Wt 209.4 lb

## 2022-05-20 DIAGNOSIS — M961 Postlaminectomy syndrome, not elsewhere classified: Secondary | ICD-10-CM | POA: Insufficient documentation

## 2022-05-20 DIAGNOSIS — M5417 Radiculopathy, lumbosacral region: Secondary | ICD-10-CM | POA: Insufficient documentation

## 2022-05-20 NOTE — Progress Notes (Signed)
Last acupuncture visit 03/13/2022.  Using gabapentin 100 mg very occasionally       Needles placed in thoracolumbar paaspinal area at BL 23 and BL 26 , as well as right BL 24, BL 25, Needle placed at GB 30 right side  Right lower limb Right GB 31 right GB 34 and right KI 3 Electrical stimulation 2.5 Hz for 20 minutes  Pt then placed in supine and needles placed ST 36, ST 31 and SP 6 Estim ST31-36 for 53min Patient tolerated procedure well  Continue treatment interval to 8wks for next treatment

## 2022-05-20 NOTE — Patient Instructions (Signed)
Acupuncture Acupuncture is a type of treatment that involves stimulating specific points on your body by inserting thin needles through your skin. Acupuncture is often used to treat pain, but it may also be used to help relieve other types of symptoms. Your health care provider may recommend acupuncture to help treat various conditions, such as: Migraine and tension headaches. Nausea and vomiting after a surgery or cancer treatment. Sudden or severe (acute) pain, or long-term (chronic) pain. Addiction. Acupuncture is based on traditional Chinese medicine, which recognizes more than 2,000 points on the body that connect energy pathways (meridians) through the body. The goal in stimulating these points is to balance the physical, emotional, and mental energy in your body. Acupuncture is done by a health care provider who has specialized training (licensed acupuncture practitioner). Treatment often requires several acupuncture sessions. You may have acupuncture along with other medical treatments. Tell a health care provider about: Any allergies you have. All medicines you are taking, including vitamins, herbs, eye drops, creams, and over-the-counter medicines. Any blood disorders you have. Any surgeries you have had. Any medical conditions you have. Whether you are pregnant or may be pregnant. What are the risks? Generally, this is a safe treatment. However, problems may occur, including: Skin infection. Damage to organs or structures that are under the skin if a needle is placed too deeply. This is rare. What happens before the treatment? Your acupuncture practitioner will ask about your medical history and your symptoms. You may have a physical exam. What happens during the treatment? The exact procedure will depend on your condition and how your acupuncture provider treats it. In general: Your skin will be cleaned with a germ-killing (antiseptic) solution. Your acupuncture practitioner will  open a new set of germ-free (sterile) needles. The needles will be gently inserted into your skin. They will be left in place for a certain amount of time. You may feel a tingling or burning sensation for a very short period of time. Your acupuncture practitioner may: Apply electrical energy to the needles. Adjust the needles in certain ways. After your procedure, the acupuncture practitioner will remove the needles, throw them away, and clean your skin. The procedure may vary among health care providers. What can I expect after the treatment? People react differently to acupuncture. Make sure you ask your acupuncture provider what to expect after your treatment. It is common to have: Minor bruising. Mild pain. A small amount of bleeding. Follow these instructions at home: Follow any instructions given by your provider after the treatment. Keep all follow-up visits. This is important. Where to find more information [facility]: www.nccih.nih.gov Contact a health care provider if: You have questions about your reaction to the treatment. You have soreness. You have skin irritation or redness. You have a fever. Summary Acupuncture is a type of treatment that involves stimulating specific points on your body by inserting thin needles through your skin. This treatment is often used to treat pain, but it may also be used to help relieve other types of symptoms. The exact procedure will depend on your condition and how your acupuncture provider treats it. This information is not intended to replace advice given to you by your health care provider. Make sure you discuss any questions you have with your health care provider. Document Revised: 12/19/2020 Document Reviewed: 12/19/2020 Elsevier Patient Education  2023 Elsevier Inc.  

## 2022-07-09 DIAGNOSIS — I77810 Thoracic aortic ectasia: Secondary | ICD-10-CM | POA: Diagnosis not present

## 2022-07-09 DIAGNOSIS — I081 Rheumatic disorders of both mitral and tricuspid valves: Secondary | ICD-10-CM | POA: Diagnosis not present

## 2022-07-09 DIAGNOSIS — I7121 Aneurysm of the ascending aorta, without rupture: Secondary | ICD-10-CM | POA: Diagnosis not present

## 2022-07-17 ENCOUNTER — Encounter
Payer: No Typology Code available for payment source | Attending: Physical Medicine & Rehabilitation | Admitting: Physical Medicine & Rehabilitation

## 2022-07-17 ENCOUNTER — Encounter: Payer: Self-pay | Admitting: Physical Medicine & Rehabilitation

## 2022-07-17 VITALS — BP 127/75 | HR 70 | Ht 71.0 in | Wt 206.0 lb

## 2022-07-17 DIAGNOSIS — M5416 Radiculopathy, lumbar region: Secondary | ICD-10-CM | POA: Diagnosis not present

## 2022-07-17 DIAGNOSIS — M961 Postlaminectomy syndrome, not elsewhere classified: Secondary | ICD-10-CM | POA: Diagnosis present

## 2022-07-17 NOTE — Progress Notes (Signed)
Last acupuncture visit 05/20/2022.  Using gabapentin 100 mg very occasionally       Needles placed in thoracolumbar paaspinal area at BL 23 and BL 26 , as well as right BL 24, BL 25, Needle placed at GB 30 right side  Right lower limb Right GB 31 right GB 34 and right KI 3 Electrical stimulation 2.5 Hz for 20 minutes  Pt then placed in supine and needles placed ST 36, ST 31 and SP 6 Estim ST31-36 for 29min Patient tolerated procedure well  increase treatment interval to 10wks for next treatment

## 2022-07-17 NOTE — Patient Instructions (Signed)
Back Exercises These exercises help to make your trunk and back strong. They also help to keep the lower back flexible. Doing these exercises can help to prevent or lessen pain in your lower back. If you have back pain, try to do these exercises 2-3 times each day or as told by your doctor. As you get better, do the exercises once each day. Repeat the exercises more often as told by your doctor. To stop back pain from coming back, do the exercises once each day, or as told by your doctor. Do exercises exactly as told by your doctor. Stop right away if you feel sudden pain or your pain gets worse. Exercises Single knee to chest Do these steps 3-5 times in a row for each leg: Lie on your back on a firm bed or the floor with your legs stretched out. Bring one knee to your chest. Grab your knee or thigh with both hands and hold it in place. Pull on your knee until you feel a gentle stretch in your lower back or butt. Keep doing the stretch for 10-30 seconds. Slowly let go of your leg and straighten it. Pelvic tilt Do these steps 5-10 times in a row: Lie on your back on a firm bed or the floor with your legs stretched out. Bend your knees so they point up to the ceiling. Your feet should be flat on the floor. Tighten your lower belly (abdomen) muscles to press your lower back against the floor. This will make your tailbone point up to the ceiling instead of pointing down to your feet or the floor. Stay in this position for 5-10 seconds while you gently tighten your muscles and breathe evenly. Cat-cow Do these steps until your lower back bends more easily: Get on your hands and knees on a firm bed or the floor. Keep your hands under your shoulders, and keep your knees under your hips. You may put padding under your knees. Let your head hang down toward your chest. Tighten (contract) the muscles in your belly. Point your tailbone toward the floor so your lower back becomes rounded like the back of a  cat. Stay in this position for 5 seconds. Slowly lift your head. Let the muscles of your belly relax. Point your tailbone up toward the ceiling so your back forms a sagging arch like the back of a cow. Stay in this position for 5 seconds.  Press-ups Do these steps 5-10 times in a row: Lie on your belly (face-down) on a firm bed or the floor. Place your hands near your head, about shoulder-width apart. While you keep your back relaxed and keep your hips on the floor, slowly straighten your arms to raise the top half of your body and lift your shoulders. Do not use your back muscles. You may change where you place your hands to make yourself more comfortable. Stay in this position for 5 seconds. Keep your back relaxed. Slowly return to lying flat on the floor.  Bridges Do these steps 10 times in a row: Lie on your back on a firm bed or the floor. Bend your knees so they point up to the ceiling. Your feet should be flat on the floor. Your arms should be flat at your sides, next to your body. Tighten your butt muscles and lift your butt off the floor until your waist is almost as high as your knees. If you do not feel the muscles working in your butt and the back of   your thighs, slide your feet 1-2 inches (2.5-5 cm) farther away from your butt. Stay in this position for 3-5 seconds. Slowly lower your butt to the floor, and let your butt muscles relax. If this exercise is too easy, try doing it with your arms crossed over your chest. Belly crunches Do these steps 5-10 times in a row: Lie on your back on a firm bed or the floor with your legs stretched out. Bend your knees so they point up to the ceiling. Your feet should be flat on the floor. Cross your arms over your chest. Tip your chin a little bit toward your chest, but do not bend your neck. Tighten your belly muscles and slowly raise your chest just enough to lift your shoulder blades a tiny bit off the floor. Avoid raising your body  higher than that because it can put too much stress on your lower back. Slowly lower your chest and your head to the floor. Back lifts Do these steps 5-10 times in a row: Lie on your belly (face-down) with your arms at your sides, and rest your forehead on the floor. Tighten the muscles in your legs and your butt. Slowly lift your chest off the floor while you keep your hips on the floor. Keep the back of your head in line with the curve in your back. Look at the floor while you do this. Stay in this position for 3-5 seconds. Slowly lower your chest and your face to the floor. Contact a doctor if: Your back pain gets a lot worse when you do an exercise. Your back pain does not get better within 2 hours after you exercise. If you have any of these problems, stop doing the exercises. Do not do them again unless your doctor says it is okay. Get help right away if: You have sudden, very bad back pain. If this happens, stop doing the exercises. Do not do them again unless your doctor says it is okay. This information is not intended to replace advice given to you by your health care provider. Make sure you discuss any questions you have with your health care provider. Document Revised: 06/27/2020 Document Reviewed: 06/27/2020 Elsevier Patient Education  2023 [employer]  

## 2022-09-25 ENCOUNTER — Encounter
Payer: No Typology Code available for payment source | Attending: Physical Medicine & Rehabilitation | Admitting: Physical Medicine & Rehabilitation

## 2022-09-25 ENCOUNTER — Encounter: Payer: Self-pay | Admitting: Physical Medicine & Rehabilitation

## 2022-09-25 VITALS — BP 124/79 | HR 71 | Ht 71.0 in | Wt 206.0 lb

## 2022-09-25 DIAGNOSIS — M961 Postlaminectomy syndrome, not elsewhere classified: Secondary | ICD-10-CM | POA: Diagnosis present

## 2022-09-25 DIAGNOSIS — M5416 Radiculopathy, lumbar region: Secondary | ICD-10-CM

## 2022-09-25 NOTE — Progress Notes (Signed)
Last acupuncture visit 07/17/2022.  Using gabapentin 100 mg very occasionally       Needles placed in thoracolumbar paaspinal area at BL 23 and BL 26 , as well as right BL 24, BL 25, Needle placed at GB 30 right side  Right lower limb Right GB 31 right GB 34 and right KI 3 Electrical stimulation 2.5 Hz for 20 minutes  Pt then placed in supine and needles placed ST 36, ST 31 and SP 6 Estim ST31-36 for Patient tolerated procedure well

## 2022-09-25 NOTE — Patient Instructions (Signed)
Acupuncture Acupuncture is a type of treatment that involves stimulating specific points on your body by inserting thin needles through your skin. Acupuncture is often used to treat pain, but it may also be used to help relieve other types of symptoms. Your health care provider may recommend acupuncture to help treat various conditions, such as: Migraine and tension headaches. Nausea and vomiting after a surgery or cancer treatment. Sudden or severe (acute) pain, or long-term (chronic) pain. Addiction. Acupuncture is based on traditional [place] medicine, which recognizes more than 2,000 points on the body that connect energy pathways (meridians) through the body. The goal in stimulating these points is to balance the physical, emotional, and mental energy in your body. Acupuncture is done by a health care provider who has specialized training (licensed acupuncture practitioner). Treatment often requires several acupuncture sessions. You may have acupuncture along with other medical treatments. Tell a health care provider about: Any allergies you have. All medicines you are taking, including vitamins, herbs, eye drops, creams, and over-the-counter medicines. Any blood disorders you have. Any surgeries you have had. Any medical conditions you have. Whether you are pregnant or may be pregnant. What are the risks? Generally, this is a safe treatment. However, problems may occur, including: Skin infection. Damage to organs or structures that are under the skin if a needle is placed too deeply. This is rare. What happens before the treatment? Your acupuncture practitioner will ask about your medical history and your symptoms. You may have a physical exam. What happens during the treatment? The exact procedure will depend on your condition and how your acupuncture provider treats it. In general: Your skin will be cleaned with a germ-killing (antiseptic) solution. Your acupuncture practitioner will  open a new set of germ-free (sterile) needles. The needles will be gently inserted into your skin. They will be left in place for a certain amount of time. You may feel a tingling or burning sensation for a very short period of time. Your acupuncture practitioner may: Apply electrical energy to the needles. Adjust the needles in certain ways. After your procedure, the acupuncture practitioner will remove the needles, throw them away, and clean your skin. The procedure may vary among health care providers. What can I expect after the treatment? People react differently to acupuncture. Make sure you ask your acupuncture provider what to expect after your treatment. It is common to have: Minor bruising. Mild pain. A small amount of bleeding. Follow these instructions at home: Follow any instructions given by your provider after the treatment. Keep all follow-up visits. This is important. Where to find more information [facility]: [email or web address] Contact a health care provider if: You have questions about your reaction to the treatment. You have soreness. You have skin irritation or redness. You have a fever. Summary Acupuncture is a type of treatment that involves stimulating specific points on your body by inserting thin needles through your skin. This treatment is often used to treat pain, but it may also be used to help relieve other types of symptoms. The exact procedure will depend on your condition and how your acupuncture provider treats it. This information is not intended to replace advice given to you by your health care provider. Make sure you discuss any questions you have with your health care provider. Document Revised: 12/19/2020 Document Reviewed: 12/19/2020 Elsevier Patient Education  2024 [employer].

## 2022-12-04 ENCOUNTER — Encounter: Payer: Self-pay | Admitting: Physical Medicine & Rehabilitation

## 2022-12-04 ENCOUNTER — Encounter
Payer: No Typology Code available for payment source | Attending: Physical Medicine & Rehabilitation | Admitting: Physical Medicine & Rehabilitation

## 2022-12-04 VITALS — BP 144/81 | HR 86 | Ht 71.0 in | Wt 209.0 lb

## 2022-12-04 DIAGNOSIS — M961 Postlaminectomy syndrome, not elsewhere classified: Secondary | ICD-10-CM | POA: Insufficient documentation

## 2022-12-04 NOTE — Progress Notes (Signed)
Last acupuncture visit 09/25/2022.  Not using gabapentin 100 mg   The patient feels like his symptoms are now on both sides of the low back as well as going into both legs.   Needles placed in thoracolumbar paaspinal area at BL 23 and BL 26 , as well as bilateral BL 24, BL 25, Needle placed at GB 30 Bilateral   Right lower limb BilatGB 31 bilateral GB 34 and bilateral SP-6 Electrical stimulation 2.5 Hz for 20 minutes  Pt then placed in supine and needles placed bilateral ST 36, ST 31 and KI 3 Estim ST31-36 for Patient tolerated procedure well  Advised gabapentin 100mg  Qhs

## 2022-12-23 DIAGNOSIS — H5203 Hypermetropia, bilateral: Secondary | ICD-10-CM | POA: Diagnosis not present

## 2022-12-23 DIAGNOSIS — H524 Presbyopia: Secondary | ICD-10-CM | POA: Diagnosis not present

## 2022-12-23 DIAGNOSIS — H43813 Vitreous degeneration, bilateral: Secondary | ICD-10-CM | POA: Diagnosis not present

## 2022-12-23 DIAGNOSIS — H52203 Unspecified astigmatism, bilateral: Secondary | ICD-10-CM | POA: Diagnosis not present

## 2022-12-23 DIAGNOSIS — H04123 Dry eye syndrome of bilateral lacrimal glands: Secondary | ICD-10-CM | POA: Diagnosis not present

## 2023-02-12 ENCOUNTER — Encounter
Payer: No Typology Code available for payment source | Attending: Physical Medicine & Rehabilitation | Admitting: Physical Medicine & Rehabilitation

## 2023-02-12 ENCOUNTER — Encounter: Payer: Self-pay | Admitting: Physical Medicine & Rehabilitation

## 2023-02-12 VITALS — BP 132/79 | HR 76 | Ht 71.0 in | Wt 211.0 lb

## 2023-02-12 DIAGNOSIS — M961 Postlaminectomy syndrome, not elsewhere classified: Secondary | ICD-10-CM

## 2023-02-12 DIAGNOSIS — M5416 Radiculopathy, lumbar region: Secondary | ICD-10-CM | POA: Diagnosis present

## 2023-02-12 NOTE — Patient Instructions (Signed)
If pain worsens take gabapentin 100mg  every evening

## 2023-02-12 NOTE — Progress Notes (Signed)
Last acupuncture visit 12/04/2022.  Using gabapentin 100 mg  PRN The patient feels like his symptoms are primarily on right low back and RLE.   Needles placed in thoracolumbar paaspinal area at BL 23 and BL 26 , as well as Right BL 24, BL 25, Needle placed at GB 30 RIght   Right lower limb BilatGB 31 bilateral GB 34 and bilateral SP-6 Electrical stimulation 2.5 Hz for 20 minutes  Pt then placed in supine and needles placed bilateral ST 36, ST 31 and KI 3 Estim ST31-36 for Patient tolerated procedure well  Advised gabapentin 100mg  at bedtime Cont TENs stimulation to Right low back and RIght thigh for pain flareups

## 2023-03-18 DIAGNOSIS — E7800 Pure hypercholesterolemia, unspecified: Secondary | ICD-10-CM | POA: Diagnosis not present

## 2023-03-18 DIAGNOSIS — Z125 Encounter for screening for malignant neoplasm of prostate: Secondary | ICD-10-CM | POA: Diagnosis not present

## 2023-03-18 DIAGNOSIS — Z0000 Encounter for general adult medical examination without abnormal findings: Secondary | ICD-10-CM | POA: Diagnosis not present

## 2023-03-18 DIAGNOSIS — N183 Chronic kidney disease, stage 3 unspecified: Secondary | ICD-10-CM | POA: Diagnosis not present

## 2023-03-18 DIAGNOSIS — Z79899 Other long term (current) drug therapy: Secondary | ICD-10-CM | POA: Diagnosis not present

## 2023-03-24 DIAGNOSIS — N183 Chronic kidney disease, stage 3 unspecified: Secondary | ICD-10-CM | POA: Diagnosis not present

## 2023-03-24 DIAGNOSIS — I719 Aortic aneurysm of unspecified site, without rupture: Secondary | ICD-10-CM | POA: Diagnosis not present

## 2023-03-24 DIAGNOSIS — I700 Atherosclerosis of aorta: Secondary | ICD-10-CM | POA: Diagnosis not present

## 2023-03-24 DIAGNOSIS — E7800 Pure hypercholesterolemia, unspecified: Secondary | ICD-10-CM | POA: Diagnosis not present

## 2023-03-24 DIAGNOSIS — Z0000 Encounter for general adult medical examination without abnormal findings: Secondary | ICD-10-CM | POA: Diagnosis not present

## 2023-03-24 DIAGNOSIS — Z79899 Other long term (current) drug therapy: Secondary | ICD-10-CM | POA: Diagnosis not present

## 2023-05-14 ENCOUNTER — Ambulatory Visit: Payer: Self-pay | Admitting: Physical Medicine & Rehabilitation

## 2023-06-04 ENCOUNTER — Encounter
Payer: No Typology Code available for payment source | Attending: Physical Medicine & Rehabilitation | Admitting: Physical Medicine & Rehabilitation

## 2023-06-04 ENCOUNTER — Encounter: Payer: Self-pay | Admitting: Physical Medicine & Rehabilitation

## 2023-06-04 VITALS — BP 132/86 | HR 74 | Ht 71.0 in | Wt 204.0 lb

## 2023-06-04 DIAGNOSIS — M961 Postlaminectomy syndrome, not elsewhere classified: Secondary | ICD-10-CM | POA: Diagnosis present

## 2023-06-04 DIAGNOSIS — M5416 Radiculopathy, lumbar region: Secondary | ICD-10-CM | POA: Diagnosis present

## 2023-06-04 NOTE — Progress Notes (Signed)
 Last acupuncture visit 02/12/2023.  Using gabapentin  100 mg  PRN The patient feels like his symptoms are primarily on right low back and RLE. Can tell that pain started worsening at ~66mo  Needles placed in thoracolumbar paraspinal area at BL 23 and BL 26 , as well as Right BL 24, BL 25, Needle placed at GB 30 RIght, electrical stimulation between left BL 23 and left BL 26 as well as between right BL 23 right BL 24 right ACL 26 and right GB 32.5 Hz x 25 minutes  Right lower limb GB 31 , ST 31 GB 34 and  SP-6 Electrical stimulation  2.5 Hz for 25 minutes  Patient tolerated procedure well  Advised gabapentin  100mg  at bedtime Cont TENs stimulation to Right low back and RIght thigh for pain flareups

## 2023-06-04 NOTE — Patient Instructions (Signed)
 Acupuncture Acupuncture is a type of treatment that involves stimulating specific points on your body by inserting thin needles through your skin. Acupuncture is often used to treat pain, but it may also be used to help relieve other types of symptoms. Your health care provider may recommend acupuncture to help treat various conditions, such as: Migraine and tension headaches. Nausea and vomiting after a surgery or cancer treatment. Sudden or severe (acute) pain, or long-term (chronic) pain. Addiction. Acupuncture is based on traditional [place] medicine, which recognizes more than 2,000 points on the body that connect energy pathways (meridians) through the body. The goal in stimulating these points is to balance the physical, emotional, and mental energy in your body. Acupuncture is done by a health care provider who has specialized training (licensed acupuncture practitioner). Treatment often requires several acupuncture sessions. You may have acupuncture along with other medical treatments. Tell a health care provider about: Any allergies you have. All medicines you are taking, including vitamins, herbs, eye drops, creams, and over-the-counter medicines. Any blood disorders you have. Any surgeries you have had. Any medical conditions you have. Whether you are pregnant or may be pregnant. What are the risks? Generally, this is a safe treatment. However, problems may occur, including: Skin infection. Damage to organs or structures that are under the skin if a needle is placed too deeply. This is rare. What happens before the treatment? Your acupuncture practitioner will ask about your medical history and your symptoms. You may have a physical exam. What happens during the treatment? The exact procedure will depend on your condition and how your acupuncture provider treats it. In general: Your skin will be cleaned with a germ-killing (antiseptic) solution. Your acupuncture practitioner will  open a new set of germ-free (sterile) needles. The needles will be gently inserted into your skin. They will be left in place for a certain amount of time. You may feel a tingling or burning sensation for a very short period of time. Your acupuncture practitioner may: Apply electrical energy to the needles. Adjust the needles in certain ways. After your procedure, the acupuncture practitioner will remove the needles, throw them away, and clean your skin. The procedure may vary among health care providers. What can I expect after the treatment? People react differently to acupuncture. Make sure you ask your acupuncture provider what to expect after your treatment. It is common to have: Minor bruising. Mild pain. A small amount of bleeding. Follow these instructions at home: Follow any instructions given by your provider after the treatment. Keep all follow-up visits. This is important. Where to find more information [facility]: [email or web address] Contact a health care provider if: You have questions about your reaction to the treatment. You have soreness. You have skin irritation or redness. You have a fever. Summary Acupuncture is a type of treatment that involves stimulating specific points on your body by inserting thin needles through your skin. This treatment is often used to treat pain, but it may also be used to help relieve other types of symptoms. The exact procedure will depend on your condition and how your acupuncture provider treats it. This information is not intended to replace advice given to you by your health care provider. Make sure you discuss any questions you have with your health care provider. Document Revised: 12/19/2020 Document Reviewed: 12/19/2020 Elsevier Patient Education  2024 [employer].

## 2023-07-08 DIAGNOSIS — I7121 Aneurysm of the ascending aorta, without rupture: Secondary | ICD-10-CM | POA: Diagnosis not present

## 2023-07-08 DIAGNOSIS — Z79899 Other long term (current) drug therapy: Secondary | ICD-10-CM | POA: Diagnosis not present

## 2023-07-08 DIAGNOSIS — Z7982 Long term (current) use of aspirin: Secondary | ICD-10-CM | POA: Diagnosis not present

## 2023-09-03 ENCOUNTER — Encounter: Payer: Self-pay | Attending: Physical Medicine & Rehabilitation | Admitting: Physical Medicine & Rehabilitation

## 2023-09-03 ENCOUNTER — Encounter: Payer: Self-pay | Admitting: Physical Medicine & Rehabilitation

## 2023-09-03 VITALS — BP 133/82 | HR 73 | Ht 71.0 in | Wt 210.0 lb

## 2023-09-03 DIAGNOSIS — M961 Postlaminectomy syndrome, not elsewhere classified: Secondary | ICD-10-CM | POA: Insufficient documentation

## 2023-09-03 DIAGNOSIS — M5416 Radiculopathy, lumbar region: Secondary | ICD-10-CM | POA: Diagnosis present

## 2023-09-03 MED ORDER — GABAPENTIN 300 MG PO CAPS: 300.0000 mg | ORAL_CAPSULE | Freq: Every day | ORAL | 2 refills | Status: AC

## 2023-09-03 NOTE — Progress Notes (Signed)
 Subjective:    Patient ID: George Aguirre, male    DOB: [birth year], 68 y.o.   MRN: [id number]  HPI 68 year old male with lumbar postlaminectomy syndrome treated at this clinic for several years with acupuncture treatments.  The patient has had primarily low back pain as well as right lower extremity pain.  He initially was severely limited by his pain and was on multiple medications for this and had reduced functional capacity.  After starting acupuncture he has been able to reduce his pain levels to a mild to moderate level, reduce medication usage as well as increase his activity level including losing around 50 pounds of weight. Still stretching back and leg, bridges, calf and hamstring, cat/cow exercise  TENS  Gabapentin  200-300mg  at bedtime intermittently  The patient now lives in [place] Pain Inventory Average Pain 4 Pain Right Now 3 My pain is intermittent, sharp, and aching  In the last 24 hours, has pain interfered with the following? General activity 3 Relation with others 3 Enjoyment of life 2 What TIME of day is your pain at its worst? night Sleep (in general) Fair  Pain is worse with: walking, sitting, and standing Pain improves with: therapy/exercise and TENS Relief from Meds: 4  Family History  Adopted: Yes   Social History   Socioeconomic History   Marital status: Married    Spouse name: Not on file   Number of children: Not on file   Years of education: Not on file   Highest education level: Not on file  Occupational History   Not on file  Tobacco Use   Smoking status: Never   Smokeless tobacco: Never  Vaping Use   Vaping status: Never Used  Substance and Sexual Activity   Alcohol use: No   Drug use: No   Sexual activity: Not on file  Other Topics Concern   Not on file  Social History Narrative   Not on file   Social Drivers of Health   Financial Resource Strain: Not on file  Food Insecurity: Low Risk  (07/08/2023)   Received from  [facility]   Hunger Vital Sign    Worried About Running Out of Food in the Last Year: Never true    Ran Out of Food in the Last Year: Never true  Transportation Needs: No Transportation Needs (07/08/2023)   Received from [facility]    In the past 12 months, has lack of reliable transportation kept you from medical appointments, meetings, work or from getting things needed for daily living? : No  Physical Activity: Not on file  Stress: Not on file  Social Connections: Not on file   Past Surgical History:  Procedure Laterality Date   ANKLE SURGERY     left ankle, growth removal   ANTERIOR LAT LUMBAR FUSION Right 07/03/2016   Procedure: RIGHT SIDED LUMBAR 4-5 LATERAL INTERBODY FUSION WITH INSTRUMENTATION AND ALLOGRAFT;  Surgeon: Virl Grimes, MD;  Location: [facility] OR;  Service: Orthopedics;  Laterality: Right;  RIGHT SIDED LUMBAR 4-5 LATERAL INTERBODY FUSION WITH INSTRUMENTATION AND ALLOGRAFT   BACK SURGERY     TONSILLECTOMY     WISDOM TOOTH EXTRACTION     Past Surgical History:  Procedure Laterality Date   ANKLE SURGERY     left ankle, growth removal   ANTERIOR LAT LUMBAR FUSION Right 07/03/2016   Procedure: RIGHT SIDED LUMBAR 4-5 LATERAL INTERBODY FUSION WITH INSTRUMENTATION AND ALLOGRAFT;  Surgeon: Virl Grimes, MD;  Location: [facility] OR;  Service:  Orthopedics;  Laterality: Right;  RIGHT SIDED LUMBAR 4-5 LATERAL INTERBODY FUSION WITH INSTRUMENTATION AND ALLOGRAFT   BACK SURGERY     TONSILLECTOMY     WISDOM TOOTH EXTRACTION     Past Medical History:  Diagnosis Date   Aorta aneurysm (HCC) 02/2021   Chronic pain    Complication of anesthesia    had problem with ketamine (hard to wake up and unable to breath, re-intubated after first back surgery)   GERD (gastroesophageal reflux disease)    Hypertension    BP 133/82   Pulse 73   Ht 5\' 11"  (1.803 m)   Wt 210 lb (95.3 kg)   SpO2 98%   BMI 29.29 kg/m   Opioid Risk Score:   Fall Risk Score:  `1  Depression  screen [facility] 2/9     06/04/2023   10:20 AM 12/04/2022    9:28 AM 09/25/2022    9:25 AM 07/17/2022    9:29 AM 05/20/2022    9:25 AM 03/13/2022    9:44 AM 11/12/2021    1:31 PM  Depression screen [facility] 2/9  Decreased Interest 0 0 0 0 0 0 0  Down, Depressed, Hopeless 0 0 0  0 0 0  PHQ - 2 Score 0 0 0 0 0 0 0     Review of Systems  Musculoskeletal:        Right leg pain  All other systems reviewed and are negative.     Objective:   Physical Exam  No acute distress Mood and affect appropriate Extremities without edema Lumbar spine range of motion 50% flexion extension lateral bending Negative straight leg raising bilaterally Motor strength is 4+ in the right hip flexor knee extensor ankle dorsiflexor 5/5 in left hip flexor knee extensor ankle dorsiflexor Ambulates without assistive device no evidence of toe drag or knee instability Tone is normal in the lower limbs      Assessment & Plan:   #1.  Lumbar postlaminectomy syndrome with chronic right lower extremity radicular pain overall improved with acupuncture treatments.  He has not had a treatment for greater than 3 months and is still doing well.  He uses intermittent gabapentin  taking 2-3 100 mg tablets at night as needed.  He only does this perhaps 5 nights a month. We discussed increasing his gabapentin  dosage to 300 mg so he can take 1 tablet at night as needed. We discussed no acupuncture treatments unless his pain starts to worsen again. I will see him back in 6 months.  Certainly if his pain starts increasing again he can give a call and we can resume some acupuncture treatments

## 2024-03-03 ENCOUNTER — Ambulatory Visit: Payer: Self-pay | Admitting: Physical Medicine & Rehabilitation

## 2024-03-08 ENCOUNTER — Encounter: Payer: Self-pay | Admitting: Physical Medicine & Rehabilitation

## 2024-03-08 ENCOUNTER — Encounter: Payer: Self-pay | Attending: Physical Medicine & Rehabilitation | Admitting: Physical Medicine & Rehabilitation

## 2024-03-08 VITALS — BP 125/84 | HR 77 | Ht 71.0 in | Wt 218.0 lb

## 2024-03-08 DIAGNOSIS — M961 Postlaminectomy syndrome, not elsewhere classified: Secondary | ICD-10-CM | POA: Insufficient documentation

## 2024-03-08 DIAGNOSIS — M5416 Radiculopathy, lumbar region: Secondary | ICD-10-CM | POA: Insufficient documentation

## 2024-03-08 NOTE — Progress Notes (Signed)
 Subjective:    Patient ID: George Aguirre Ill, male    DOB: 1956/04/19, 68 y.o.   MRN: [id number]  HPI Discussed the use of AI scribe software for clinical note transcription with the patient, who gave verbal consent to proceed.  History of Present Illness George Aguirre is a 68 year old male who presents with worsening leg pain and sleep disturbances.  He has been experiencing increasing pain radiating down his leg for the past three to four months, with a recent escalation in severity, particularly at night, which significantly disrupts his sleep. The pain is described as sharp and piercing, causing frequent awakenings and preventing restful sleep. He has not slept well for about a week.  He has been taking gabapentin  300 mg intermittently during episodes of increased pain but finds it is no longer effective. He is trying to avoid medication and therapy, preferring to manage his condition without them. Previously, he found relief with acupuncture and TENS treatments, but he is not currently using a TENS unit as he does not have one. His wife purchased a unit from [facility], but it was not effective.  Due to the pain, he has decreased his physical activity, stating he is not walking as much as he used to because it hurts. He has gained weight and now walks three to four times a week, primarily in grocery stores to avoid uneven surfaces and reduce the risk of falling. He used to walk once or twice a day, which he found beneficial.  He reports weakness in his right foot, particularly when there is a change in elevation, describing it as 'flopping.' He believes the weakness may be related to reduced activity and pain.   Pain Inventory Average Pain 4 Pain Right Now 6 My pain is intermittent, constant, sharp, burning, dull, and aching  In the last 24 hours, has pain interfered with the following? General activity 7 Relation with others 4 Enjoyment of life 7 What TIME of day is your pain at its  worst? night Sleep (in general) Fair  Pain is worse with: walking, sitting, and standing Pain improves with: therapy/exercise Relief from Meds: 3  Family History  Adopted: Yes   Social History   Socioeconomic History   Marital status: Married    Spouse name: Not on file   Number of children: Not on file   Years of education: Not on file   Highest education level: Not on file  Occupational History   Not on file  Tobacco Use   Smoking status: Never   Smokeless tobacco: Never  Vaping Use   Vaping status: Never Used  Substance and Sexual Activity   Alcohol use: No   Drug use: No   Sexual activity: Not on file  Other Topics Concern   Not on file  Social History Narrative   Not on file   Social Drivers of Health   Financial Resource Strain: Not on file  Food Insecurity: Low Risk  (07/08/2023)   Received from [facility]   Hunger Vital Sign    Within the past 12 months, you worried that your food would run out before you got money to buy more: Never true    Within the past 12 months, the food you bought just didn't last and you didn't have money to get more. : Never true  Transportation Needs: No Transportation Needs (07/08/2023)   Received from [facility]    In the past 12 months, has lack  of reliable transportation kept you from medical appointments, meetings, work or from getting things needed for daily living? : No  Physical Activity: Not on file  Stress: Not on file  Social Connections: Not on file   Past Surgical History:  Procedure Laterality Date   ANKLE SURGERY     left ankle, growth removal   ANTERIOR LAT LUMBAR FUSION Right 07/03/2016   Procedure: RIGHT SIDED LUMBAR 4-5 LATERAL INTERBODY FUSION WITH INSTRUMENTATION AND ALLOGRAFT;  Surgeon: Oneil Priestly, MD;  Location: [facility] OR;  Service: Orthopedics;  Laterality: Right;  RIGHT SIDED LUMBAR 4-5 LATERAL INTERBODY FUSION WITH INSTRUMENTATION AND ALLOGRAFT   BACK SURGERY     TONSILLECTOMY      WISDOM TOOTH EXTRACTION     Past Surgical History:  Procedure Laterality Date   ANKLE SURGERY     left ankle, growth removal   ANTERIOR LAT LUMBAR FUSION Right 07/03/2016   Procedure: RIGHT SIDED LUMBAR 4-5 LATERAL INTERBODY FUSION WITH INSTRUMENTATION AND ALLOGRAFT;  Surgeon: Oneil Priestly, MD;  Location: [facility] OR;  Service: Orthopedics;  Laterality: Right;  RIGHT SIDED LUMBAR 4-5 LATERAL INTERBODY FUSION WITH INSTRUMENTATION AND ALLOGRAFT   BACK SURGERY     TONSILLECTOMY     WISDOM TOOTH EXTRACTION     Past Medical History:  Diagnosis Date   Aorta aneurysm 02/2021   Chronic pain    Complication of anesthesia    had problem with ketamine (hard to wake up and unable to breath, re-intubated after first back surgery)   GERD (gastroesophageal reflux disease)    Hypertension    Ht 5' 11 (1.803 m)   Wt 218 lb (98.9 kg)   BMI 30.40 kg/m   Opioid Risk Score:   Fall Risk Score:  `1  Depression screen [facility] 2/9     06/04/2023   10:20 AM 12/04/2022    9:28 AM 09/25/2022    9:25 AM 07/17/2022    9:29 AM 05/20/2022    9:25 AM 03/13/2022    9:44 AM 11/12/2021    1:31 PM  Depression screen PH[facility] 2/9  Decreased Interest 0 0 0 0 0 0 0  Down, Depressed, Hopeless 0 0 0  0 0 0  PHQ - 2 Score 0 0 0 0 0 0 0    Review of Systems  Musculoskeletal:  Positive for back pain.       Right leg pain  All other systems reviewed and are negative.      Objective:   Physical Exam General No acute distress Mood and affect appropriate Lumbar spine has some tenderness palpation PSIS right greater than left side. Sensation mildly reduced left lateral leg Motor strength is 4/5 right ankle dorsiflexor 5/5 left ankle dorsiflexor 5/5 bilateral hip flexor knee extensor. 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 Gabapentin  300 mg ineffective. Pain management essential for functional improvement. - Prescribed nightly gabapentin  for 3-4 weeks. - Advised TENS unit use for pain. - Consider acupuncture if pain persists after resuming TENS unit and gabapentin  use. - Encouraged increased physical activity. - Scheduled follow-up in 3 months to reassess.

## 2024-06-07 ENCOUNTER — Encounter: Payer: Self-pay | Admitting: Physical Medicine & Rehabilitation

## 2024-09-06 ENCOUNTER — Encounter: Payer: Self-pay | Admitting: Physical Medicine & Rehabilitation

## 2024-09-13 ENCOUNTER — Encounter: Payer: Self-pay | Admitting: Physical Medicine & Rehabilitation
